# Patient Record
Sex: Male | Born: 1960 | Race: White | Hispanic: No | Marital: Single | Smoking: Former smoker
Health system: Southern US, Community
[De-identification: ages and names within clinical notes are randomized; demographics above are authoritative.]

## PROBLEM LIST (undated history)

## (undated) DIAGNOSIS — I10 Essential (primary) hypertension: Secondary | ICD-10-CM

## (undated) DIAGNOSIS — F32A Depression, unspecified: Secondary | ICD-10-CM

## (undated) DIAGNOSIS — H8110 Benign paroxysmal vertigo, unspecified ear: Secondary | ICD-10-CM

## (undated) DIAGNOSIS — K4090 Unilateral inguinal hernia, without obstruction or gangrene, not specified as recurrent: Secondary | ICD-10-CM

## (undated) DIAGNOSIS — F419 Anxiety disorder, unspecified: Secondary | ICD-10-CM

## (undated) DIAGNOSIS — E7800 Pure hypercholesterolemia, unspecified: Secondary | ICD-10-CM

## (undated) DIAGNOSIS — F329 Major depressive disorder, single episode, unspecified: Secondary | ICD-10-CM

## (undated) HISTORY — PX: KNEE ARTHROSCOPY: SHX127

## (undated) HISTORY — PX: KNEE SURGERY: SHX244

## (undated) HISTORY — DX: Unilateral inguinal hernia, without obstruction or gangrene, not specified as recurrent: K40.90

---

## 2013-10-25 ENCOUNTER — Emergency Department (INDEPENDENT_AMBULATORY_CARE_PROVIDER_SITE_OTHER)
Admission: EM | Admit: 2013-10-25 | Discharge: 2013-10-25 | Disposition: A | Discharge status: Home / Self Care | Payer: Self-pay | Source: Home / Self Care | Attending: Family Medicine | Admitting: Family Medicine

## 2013-10-25 ENCOUNTER — Encounter (HOSPITAL_COMMUNITY): Payer: Self-pay | Admitting: Emergency Medicine

## 2013-10-25 DIAGNOSIS — K047 Periapical abscess without sinus: Secondary | ICD-10-CM

## 2013-10-25 MED ORDER — CLINDAMYCIN HCL 300 MG PO CAPS: 300.0000 mg | ORAL_CAPSULE | Freq: Four times a day (QID) | ORAL | Status: DC

## 2013-10-25 MED ORDER — HYDROCODONE-ACETAMINOPHEN 5-325 MG PO TABS: 1.0000 | ORAL_TABLET | Freq: Four times a day (QID) | ORAL | Status: DC | PRN

## 2013-10-25 NOTE — ED Notes (Signed)
Patient denies pain and is resting comfortably.  

## 2013-10-25 NOTE — ED Notes (Signed)
C/o trouble w tooth since sometime in December

## 2013-10-25 NOTE — ED Notes (Signed)
Discussed [place] clinic in [place] 3[place]6 & 3/7  , and [facility]; discussed medication compliance and precautions

## 2013-10-25 NOTE — Progress Notes (Signed)
   CARE MANAGEMENT ED NOTE 10/25/2013  Patient:  Louis Brown,Louis Brown   Account Number:  [id number]  Date Initiated:  10/25/2013  Documentation initiated by:  Radford PaxFERRERO,Damali Broadfoot  Subjective/Objective Assessment:   [facility].     Subjective/Objective Assessment Detail:     Action/Plan:   Action/Plan Detail:   Anticipated DC Date:  10/25/2013     Status Recommendation to Physician:   Result of Recommendation:    Other ED Services  Consult Working Plan    DC Planning Services  CM consult  MATCH Program  Medication Assistance    Choice offered to / List presented to:            Status of service:  Completed, signed off  ED Comments:   ED Comments Detail:  [facility] for medication [facility]. [facility].  As per EDSW, patient without pcp or insurance and has been working with patient to find pcp.  Information for [facility] and the orange card has been provided to patient as per EDSW. MATCH program initiated.  [facility] [address] at 2017pm at phone [phone], with confirmation of receipt at 2018pm.  [facility] at phone number [phone].  [facility].  Patient was agreeable to three dollar copay.  EDCM advised patient that [facility] would not be able to assist him with medications until this time next year and that he had seven days to fill his prescription otherwise the program will void.  Patient very thankful for services.  No futher EDCM needs at this time.

## 2013-10-25 NOTE — Discharge Instructions (Signed)
°  Dental Abscess °A dental abscess is a collection of infected fluid (pus) from a bacterial infection in the inner part of the tooth (pulp). It usually occurs at the end of the tooth's root.  °CAUSES  °· Severe tooth decay. °· Trauma to the tooth that allows bacteria to enter into the pulp, such as a broken or chipped tooth. °SYMPTOMS  °· Severe pain in and around the infected tooth. °· Swelling and redness around the abscessed tooth or in the mouth or face. °· Tenderness. °· Pus drainage. °· Bad breath. °· Bitter taste in the mouth. °· Difficulty swallowing. °· Difficulty opening the mouth. °· Nausea. °· Vomiting. °· Chills. °· Swollen neck glands. °DIAGNOSIS  °· A medical and dental history will be taken. °· An examination will be performed by tapping on the abscessed tooth. °· X-rays may be taken of the tooth to identify the abscess. °TREATMENT °The goal of treatment is to eliminate the infection. You may be prescribed antibiotic medicine to stop the infection from spreading. A root canal may be performed to save the tooth. If the tooth cannot be saved, it may be pulled (extracted) and the abscess may be drained.  °HOME CARE INSTRUCTIONS °· Only take over-the-counter or prescription medicines for pain, fever, or discomfort as directed by your caregiver. °· Rinse your mouth (gargle) often with salt water (¼ tsp salt in 8 oz [250 ml] of warm water) to relieve pain or swelling. °· Do not drive after taking pain medicine (narcotics). °· Do not apply heat to the outside of your face. °· Return to your dentist for further treatment as directed. °SEEK MEDICAL CARE IF: °· Your pain is not helped by medicine. °· Your pain is getting worse instead of better. °SEEK IMMEDIATE MEDICAL CARE IF: °· You have a fever or persistent symptoms for more than 2 3 days. °· You have a fever and your symptoms suddenly get worse. °· You have chills or a very bad headache. °· You have problems breathing or swallowing. °· You have trouble  opening your mouth. °· You have swelling in the neck or around the eye. °Document Released: 08/17/2005 Document Revised: 05/11/2012 Document Reviewed: 11/25/2010 °ExitCare® Patient Information ©2014 [employer]. ° ° °

## 2013-10-25 NOTE — ED Provider Notes (Signed)
CSN: [id number]     Arrival date & time 10/25/13  1453 History   First MD Initiated Contact with Patient 10/25/13 1738     Chief Complaint  Patient presents with  . Dental Problem     (Consider location/radiation/quality/duration/timing/severity/associated sxs/prior Treatment) HPI Comments: Patient reports he develop a loose filling in left lower molar in Dec. 2015. Reports that beginning 10/20/2013 he developed increasing pain and facial swelling. Denies fever. Last dental visit about 6 years ago.   The history is provided by the patient.    History reviewed. No pertinent past medical history. Past Surgical History  Procedure Laterality Date  . Knee surgery     History reviewed. No pertinent family history. History  Substance Use Topics  . Smoking status: Never Smoker   . Smokeless tobacco: Not on file  . Alcohol Use: Yes    Review of Systems  All other systems reviewed and are negative.      Allergies  Codeine  Home Medications   Current Outpatient Rx  Name  Route  Sig  Dispense  Refill  . clindamycin (CLEOCIN) 300 MG capsule   Oral   Take 1 capsule (300 mg total) by mouth 4 (four) times daily. X 7 days   28 capsule   0   . HYDROcodone-acetaminophen (NORCO/VICODIN) 5-325 MG per tablet   Oral   Take 1-2 tablets by mouth every 6 (six) hours as needed for moderate pain or severe pain.   15 tablet   0    There were no vitals taken for this visit. Physical Exam  Nursing note reviewed. Constitutional: He is oriented to person, place, and time. He appears well-developed and well-nourished. No distress.  HENT:  Head: Normocephalic and atraumatic.    Right Ear: External ear normal.  Left Ear: External ear normal.  Nose: Nose normal.  Mouth/Throat: Uvula is midline, oropharynx is clear and moist and mucous membranes are normal. Dental caries present.    Eyes: Conjunctivae are normal.  Neck: Normal range of motion. Neck supple.  Cardiovascular: Normal  rate.   Pulmonary/Chest: Effort normal.  Musculoskeletal: Normal range of motion.  Lymphadenopathy:    He has no cervical adenopathy.  Neurological: He is alert and oriented to person, place, and time.  Skin: Skin is warm and dry.  Psychiatric: He has a normal mood and affect. His behavior is normal.    ED Course  Procedures (including critical care time) Labs Review Labs Reviewed - No data to display Imaging Review No results found.    MDM   Final diagnoses:  Dental abscess  Patient with likely dental/periapical abscess. Provided patient information [place] [facility] [place] [facility]. Will treat with oral clindamycin and pain medication and advise close dental follow up. BP recorded on intake paperwork as 165/109. Patient advised to have BP re-checked in 7-10 days.   Jess BartersJennifer Lee [place], [place] 10/25/13 [phone]

## 2013-10-26 NOTE — Progress Notes (Signed)
10/26/2013 A. Sadia Belfiore RNCM 1522pm [facility] called patient for follow up.  As per patient, he was able to get RX filled for antibiotic without difficulty and is making arrangements for a follow up visit to physician.  Patient thanked [facility] for services.

## 2013-10-27 NOTE — ED Provider Notes (Signed)
Medical screening examination/treatment/procedure(s) were performed by a resident physician or non-physician practitioner and as the supervising physician I was immediately available for consultation/collaboration.  Clementeen GrahamEvan Doran Nestle, MD    Rodolph BongEvan S Lakynn Halvorsen, MD 10/27/13 [phone]

## 2013-11-07 ENCOUNTER — Ambulatory Visit: Payer: Self-pay | Attending: Internal Medicine | Admitting: Internal Medicine

## 2013-11-07 ENCOUNTER — Encounter: Payer: Self-pay | Admitting: Internal Medicine

## 2013-11-07 VITALS — BP 131/87 | HR 74 | Temp 98.5°F | Resp 14 | Ht 72.0 in | Wt 205.8 lb

## 2013-11-07 DIAGNOSIS — R109 Unspecified abdominal pain: Secondary | ICD-10-CM | POA: Insufficient documentation

## 2013-11-07 DIAGNOSIS — K047 Periapical abscess without sinus: Secondary | ICD-10-CM | POA: Insufficient documentation

## 2013-11-07 DIAGNOSIS — K4090 Unilateral inguinal hernia, without obstruction or gangrene, not specified as recurrent: Secondary | ICD-10-CM | POA: Insufficient documentation

## 2013-11-07 DIAGNOSIS — Z0000 Encounter for general adult medical examination without abnormal findings: Secondary | ICD-10-CM

## 2013-11-07 LAB — CBC WITH DIFFERENTIAL/PLATELET
BASOS PCT: 1 % (ref 0–1)
Basophils Absolute: 0.1 10*3/uL (ref 0.0–0.1)
EOS ABS: 0.1 10*3/uL (ref 0.0–0.7)
Eosinophils Relative: 2 % (ref 0–5)
HEMATOCRIT: 44.5 % (ref 39.0–52.0)
Hemoglobin: 15.7 g/dL (ref 13.0–17.0)
Lymphocytes Relative: 28 % (ref 12–46)
Lymphs Abs: 1.6 10*3/uL (ref 0.7–4.0)
MCH: 31.2 pg (ref 26.0–34.0)
MCHC: 35.3 g/dL (ref 30.0–36.0)
MCV: 88.3 fL (ref 78.0–100.0)
MONO ABS: 0.6 10*3/uL (ref 0.1–1.0)
MONOS PCT: 11 % (ref 3–12)
NEUTROS PCT: 58 % (ref 43–77)
Neutro Abs: 3.2 10*3/uL (ref 1.7–7.7)
Platelets: 319 10*3/uL (ref 150–400)
RBC: 5.04 MIL/uL (ref 4.22–5.81)
RDW: 13 % (ref 11.5–15.5)
WBC: 5.6 10*3/uL (ref 4.0–10.5)

## 2013-11-07 LAB — COMPLETE METABOLIC PANEL WITH GFR
ALK PHOS: 60 U/L (ref 39–117)
ALT: 22 U/L (ref 0–53)
AST: 20 U/L (ref 0–37)
Albumin: 4.4 g/dL (ref 3.5–5.2)
BILIRUBIN TOTAL: 0.6 mg/dL (ref 0.2–1.2)
BUN: 17 mg/dL (ref 6–23)
CO2: 28 mEq/L (ref 19–32)
Calcium: 9.7 mg/dL (ref 8.4–10.5)
Chloride: 103 mEq/L (ref 96–112)
Creat: 1.26 mg/dL (ref 0.50–1.35)
GFR, Est African American: 76 mL/min
GFR, Est Non African American: 66 mL/min
GLUCOSE: 96 mg/dL (ref 70–99)
Potassium: 4.4 mEq/L (ref 3.5–5.3)
Sodium: 140 mEq/L (ref 135–145)
Total Protein: 7 g/dL (ref 6.0–8.3)

## 2013-11-07 LAB — LIPID PANEL
Cholesterol: 207 mg/dL — ABNORMAL HIGH (ref 0–200)
HDL: 44 mg/dL (ref >39)
LDL Cholesterol: 109 mg/dL — ABNORMAL HIGH (ref 0–99)
Total CHOL/HDL Ratio: 4.7 Ratio
Triglycerides: 269 mg/dL — ABNORMAL HIGH (ref <150)
VLDL: 54 mg/dL — ABNORMAL HIGH (ref 0–40)

## 2013-11-07 LAB — TSH: TSH: 1.603 u[IU]/mL (ref 0.350–4.500)

## 2013-11-07 MED ORDER — AMOXICILLIN-POT CLAVULANATE 875-125 MG PO TABS: 1.0000 | ORAL_TABLET | Freq: Two times a day (BID) | ORAL | Status: DC

## 2013-11-07 NOTE — Progress Notes (Signed)
Patient ID: Louis Brown, male   DOB: 08-28-1961, 53 y.o.   MRN: [id number]   CC:  HPI: 53 year old male here to establish care. The patient believes an organic farming and eats very healthy. The patient is here today with a dental abscess, recently seen in urgent care and treated with clindamycin. The patient has completed his antibiotic course. He is also complaining of left inguinal pain and has a left inguinal hernia on exam. He has no other specific complaints. He tells me that he had a colonoscopy in 2008 had 5 polyps removed.   social history nonsmoker nonalcoholic  Family history positive for diabetes and hypertension in the mother and sister Father died of glomerulonephritis   Allergies  Allergen Reactions  . Codeine    History reviewed. No pertinent past medical history. No current outpatient prescriptions on file prior to visit.   No current facility-administered medications on file prior to visit.   Family History  Problem Relation Age of Onset  . Kidney disease Father    History   Social History  . Marital Status: Single    Spouse Name: N/A    Number of Children: N/A  . Years of Education: N/A   Occupational History  . Not on file.   Social History Main Topics  . Smoking status: Never Smoker   . Smokeless tobacco: Not on file  . Alcohol Use: Yes  . Drug Use: Not on file  . Sexual Activity: Not on file   Other Topics Concern  . Not on file   Social History Narrative  . No narrative on file    Review of Systems  Constitutional: Negative for fever, chills, diaphoresis, activity change, appetite change and fatigue.  HENT: Negative for ear pain, nosebleeds, congestion, facial swelling, rhinorrhea, neck pain, neck stiffness and ear discharge.   Eyes: Negative for pain, discharge, redness, itching and visual disturbance.  Respiratory: Negative for cough, choking, chest tightness, shortness of breath, wheezing and stridor.   Cardiovascular: Negative for  chest pain, palpitations and leg swelling.  Gastrointestinal: Negative for abdominal distention.  Genitourinary: Negative for dysuria, urgency, frequency, hematuria, flank pain, decreased urine volume, difficulty urinating and dyspareunia.  Musculoskeletal: Negative for back pain, joint swelling, arthralgias and gait problem.  Neurological: Negative for dizziness, tremors, seizures, syncope, facial asymmetry, speech difficulty, weakness, light-headedness, numbness and headaches.  Hematological: Negative for adenopathy. Does not bruise/bleed easily.  Psychiatric/Behavioral: Negative for hallucinations, behavioral problems, confusion, dysphoric mood, decreased concentration and agitation.    Objective:   Filed Vitals:   11/07/13 0958  BP: 131/87  Pulse: 74  Temp: 98.5 F (36.9 C)  Resp: 14    Physical Exam  Constitutional: Appears well-developed and well-nourished. No distress.  HENT: Normocephalic. External right and left ear normal. Oropharynx is clear and moist.  Eyes: Conjunctivae and EOM are normal. PERRLA, no scleral icterus.  Neck: Normal ROM. Neck supple. No JVD. No tracheal deviation. No thyromegaly.  CVS: RRR, S1/S2 +, no murmurs, no gallops, no carotid bruit.  Pulmonary: Effort and breath sounds normal, no stridor, rhonchi, wheezes, rales.  Abdominal: Soft. BS +,  no distension, tenderness, rebound or guarding.  Musculoskeletal: Normal range of motion. No edema and no tenderness.  Lymphadenopathy: No lymphadenopathy noted, cervical, inguinal. Neuro: Alert. Normal reflexes, muscle tone coordination. No cranial nerve deficit. Skin: Skin is warm and dry. No rash noted. Not diaphoretic. No erythema. No pallor.  Psychiatric: Normal mood and affect. Behavior, judgment, thought content normal.   No results found for  this basename: WBC, HGB, HCT, MCV, PLT   No results found for this basename: CREATININE, BUN, NA, K, CL, CO2    No results found for this basename: HGBA1C    Lipid Panel  No results found for this basename: chol, trig, hdl, cholhdl, vldl, ldlcalc       Assessment and plan:   There are no active problems to display for this patient.  Left inguinal hernia Gen. surgery referral for surgery  Dental abscess Provided a prescription for Augmentin The dentist referral  Establish care Repeat colonoscopy, based on his history the patient needs a colonoscopy again. He had 5 polyps removed in 2008 Refusing vaccination   Follow up in 6-8 months      The patient was given clear instructions to go to ER or return to medical center if symptoms don't improve, worsen or new problems develop. The patient verbalized understanding. The patient was told to call to get any lab results if not heard anything in the next week.

## 2013-11-07 NOTE — Progress Notes (Signed)
Patient is here to establish care. Patient had an abscess tooth and fillings have cracked. Also complains of a hernia x3 years. Pain scale of 2 today; pain occurs with movement. No OTC medications.

## 2013-11-08 ENCOUNTER — Telehealth: Payer: Self-pay | Admitting: Emergency Medicine

## 2013-11-08 LAB — VITAMIN D 25 HYDROXY (VIT D DEFICIENCY, FRACTURES): Vit D, 25-Hydroxy: 20 ng/mL — ABNORMAL LOW (ref 30–89)

## 2013-11-08 MED ORDER — ERGOCALCIFEROL 1.25 MG (50000 UT) PO CAPS: 50000.0000 [IU] | ORAL_CAPSULE | ORAL | Status: DC

## 2013-11-08 NOTE — Addendum Note (Signed)
Addended by: Susie CassetteABROL MD, Germain OsgoodNAYANA on: 11/08/2013 09:44 AM   Modules accepted: Orders

## 2013-11-08 NOTE — Telephone Encounter (Signed)
Message copied by Darlis LoanSMITH, JILL D on Wed Nov 08, 2013  2:46 PM ------      Message from: Susie CassetteABROL MD, Germain OsgoodNAYANA      Created: Wed Nov 08, 2013  9:45 AM       Please notify patient of the patient's triglycerides are mildly elevated at 269. He is encouraged low-fat diet and moderate exercise and we will repeat cholesterol panel in 2 months.      Vitamin D level is low. Called in a prescription for vitamin D to her preferred pharmacy ------

## 2013-11-10 NOTE — Telephone Encounter (Signed)
Message copied by Darlis LoanSMITH, JILL D on Fri Nov 10, 2013  1:02 PM ------      Message from: Susie CassetteABROL MD, Germain OsgoodNAYANA      Created: Wed Nov 08, 2013  9:45 AM       Please notify patient of the patient's triglycerides are mildly elevated at 269. He is encouraged low-fat diet and moderate exercise and we will repeat cholesterol panel in 2 months.      Vitamin D level is low. Called in a prescription for vitamin D to her preferred pharmacy ------

## 2013-11-10 NOTE — Telephone Encounter (Signed)
Pt given lab  Results with instructions Verbalized understanding

## 2013-11-21 ENCOUNTER — Ambulatory Visit: Payer: Self-pay | Attending: Internal Medicine

## 2013-11-28 ENCOUNTER — Encounter (HOSPITAL_COMMUNITY): Payer: Self-pay | Admitting: Emergency Medicine

## 2013-11-28 ENCOUNTER — Emergency Department (HOSPITAL_COMMUNITY)
Admission: EM | Admit: 2013-11-28 | Discharge: 2013-11-28 | Disposition: A | Discharge status: Home / Self Care | Payer: Self-pay | Attending: Emergency Medicine | Admitting: Emergency Medicine

## 2013-11-28 DIAGNOSIS — M549 Dorsalgia, unspecified: Secondary | ICD-10-CM

## 2013-11-28 DIAGNOSIS — K4090 Unilateral inguinal hernia, without obstruction or gangrene, not specified as recurrent: Secondary | ICD-10-CM | POA: Insufficient documentation

## 2013-11-28 LAB — URINALYSIS, ROUTINE W REFLEX MICROSCOPIC
Bilirubin Urine: NEGATIVE
Glucose, UA: NEGATIVE mg/dL
Hgb urine dipstick: NEGATIVE
KETONES UR: 15 mg/dL — AB
LEUKOCYTES UA: NEGATIVE
Nitrite: NEGATIVE
PROTEIN: NEGATIVE mg/dL
Specific Gravity, Urine: 1.025 (ref 1.005–1.030)
Urobilinogen, UA: 0.2 mg/dL (ref 0.0–1.0)
pH: 5.5 (ref 5.0–8.0)

## 2013-11-28 MED ORDER — OXYCODONE-ACETAMINOPHEN 5-325 MG PO TABS: 2.0000 | ORAL_TABLET | Freq: Four times a day (QID) | ORAL | Status: DC | PRN

## 2013-11-28 MED ORDER — KETOROLAC TROMETHAMINE 60 MG/2ML IM SOLN
60.0000 mg | Freq: Once | INTRAMUSCULAR | Status: DC
  Filled 2013-11-28: qty 2

## 2013-11-28 MED ORDER — ONDANSETRON 4 MG PO TBDP
8.0000 mg | ORAL_TABLET | Freq: Once | ORAL | Status: AC
  Administered 2013-11-28: 8 mg via ORAL
  Filled 2013-11-28: qty 2

## 2013-11-28 MED ORDER — DIAZEPAM 5 MG PO TABS: 5.0000 mg | ORAL_TABLET | Freq: Two times a day (BID) | ORAL | Status: DC

## 2013-11-28 MED ORDER — OXYCODONE-ACETAMINOPHEN 5-325 MG PO TABS
2.0000 | ORAL_TABLET | Freq: Once | ORAL | Status: AC
  Administered 2013-11-28: 2 via ORAL
  Filled 2013-11-28: qty 2

## 2013-11-28 NOTE — Discharge Instructions (Signed)
Back Pain, Adult Low back pain is very common. About 1 in 5 people have back pain.The cause of low back pain is rarely dangerous. The pain often gets better over time.About half of people with a sudden onset of back pain feel better in just 2 weeks. About 8 in 10 people feel better by 6 weeks.  CAUSES Some common causes of back pain include:  Strain of the muscles or ligaments supporting the spine.  Wear and tear (degeneration) of the spinal discs.  Arthritis.  Direct injury to the back. DIAGNOSIS Most of the time, the direct cause of low back pain is not known.However, back pain can be treated effectively even when the exact cause of the pain is unknown.Answering your caregiver's questions about your overall health and symptoms is one of the most accurate ways to make sure the cause of your pain is not dangerous. If your caregiver needs more information, he or she may order lab work or imaging tests (X-rays or MRIs).However, even if imaging tests show changes in your back, this usually does not require surgery. HOME CARE INSTRUCTIONS For many people, back pain returns.Since low back pain is rarely dangerous, it is often a condition that people can learn to [facility] their own.   Remain active. It is stressful on the back to sit or stand in one place. Do not sit, drive, or stand in one place for more than 30 minutes at a time. Take short walks on level surfaces as soon as pain allows.Try to increase the length of time you walk each day.  Do not stay in bed.Resting more than 1 or 2 days can delay your recovery.  Do not avoid exercise or work.Your body is made to move.It is not dangerous to be active, even though your back may hurt.Your back will likely heal faster if you return to being active before your pain is gone.  Pay attention to your body when you bend and lift. Many people have less discomfortwhen lifting if they bend their knees, keep the load close to their bodies,and  avoid twisting. Often, the most comfortable positions are those that put less stress on your recovering back.  Find a comfortable position to sleep. Use a firm mattress and lie on your side with your knees slightly bent. If you lie on your back, put a pillow under your knees.  Only take over-the-counter or prescription medicines as directed by your caregiver. Over-the-counter medicines to reduce pain and inflammation are often the most helpful.Your caregiver may prescribe muscle relaxant drugs.These medicines help dull your pain so you can more quickly return to your normal activities and healthy exercise.  Put ice on the injured area.  Put ice in a plastic bag.  Place a towel between your skin and the bag.  Leave the ice on for 15-20 minutes, 03-04 times a day for the first 2 to 3 days. After that, ice and heat may be alternated to reduce pain and spasms.  Ask your caregiver about trying back exercises and gentle massage. This may be of some benefit.  Avoid feeling anxious or stressed.Stress increases muscle tension and can worsen back pain.It is important to recognize when you are anxious or stressed and learn ways to manage it.Exercise is a great option. SEEK MEDICAL CARE IF:  You have pain that is not relieved with rest or medicine.  You have pain that does not improve in 1 week.  You have new symptoms.  You are generally not feeling well. SEEK  IMMEDIATE MEDICAL CARE IF:   You have pain that radiates from your back into your legs.  You develop new bowel or bladder control problems.  You have unusual weakness or numbness in your arms or legs.  You develop nausea or vomiting.  You develop abdominal pain.  You feel faint. Document Released: 08/17/2005 Document Revised: 02/16/2012 Document Reviewed: 01/05/2011 [facility] Patient Information 2014 [place], [place].  Inguinal Hernia, Adult Muscles help keep everything in the body in its proper place. But if a weak spot in  the muscles develops, something can poke through. That is called a hernia. When this happens in the lower part of the belly (abdomen), it is called an inguinal hernia. (It takes its name from a part of the body in this region called the inguinal canal.) A weak spot in the wall of muscles lets some fat or part of the small intestine bulge through. An inguinal hernia can develop at any age. Men get them more often than women. CAUSES  In adults, an inguinal hernia develops over time.  It can be triggered by:  Suddenly straining the muscles of the lower abdomen.  Lifting heavy objects.  Straining to have a bowel movement. Difficult bowel movements (constipation) can lead to this.  Constant coughing. This may be caused by smoking or lung disease.  Being overweight.  Being pregnant.  Working at a job that requires long periods of standing or heavy lifting.  Having had an inguinal hernia before. One type can be an emergency situation. It is called a strangulated inguinal hernia. It develops if part of the small intestine slips through the weak spot and cannot get back into the abdomen. The blood supply can be cut off. If that happens, part of the intestine may die. This situation requires emergency surgery. SYMPTOMS  Often, a small inguinal hernia has no symptoms. It is found when a healthcare provider does a physical exam. Larger hernias usually have symptoms.   In adults, symptoms may include:  A lump in the groin. This is easier to see when the person is standing. It might disappear when lying down.  In men, a lump in the scrotum.  Pain or burning in the groin. This occurs especially when lifting, straining or coughing.  A dull ache or feeling of pressure in the groin.  Signs of a strangulated hernia can include:  A bulge in the groin that becomes very painful and tender to the touch.  A bulge that turns red or purple.  Fever, nausea and vomiting.  Inability to have a bowel  movement or to pass gas. DIAGNOSIS  To decide if you have an inguinal hernia, a healthcare provider will probably do a physical examination.  This will include asking questions about any symptoms you have noticed.  The healthcare provider might feel the groin area and ask you to cough. If an inguinal hernia is felt, the healthcare provider may try to slide it back into the abdomen.  Usually no other tests are needed. TREATMENT  Treatments can vary. The size of the hernia makes a difference. Options include:  Watchful waiting. This is often suggested if the hernia is small and you have had no symptoms.  No medical procedure will be done unless symptoms develop.  You will need to watch closely for symptoms. If any occur, contact your healthcare provider right away.  Surgery. This is used if the hernia is larger or you have symptoms.  Open surgery. This is usually an outpatient procedure (you  will not stay overnight in a hospital). An cut (incision) is made through the skin in the groin. The hernia is put back inside the abdomen. The weak area in the muscles is then repaired by herniorrhaphy or hernioplasty. Herniorrhaphy: in this type of surgery, the weak muscles are sewn back together. Hernioplasty: a patch or mesh is used to close the weak area in the abdominal wall.  Laparoscopy. In this procedure, a surgeon makes small incisions. A thin tube with a tiny video camera (called a laparoscope) is put into the abdomen. The surgeon repairs the hernia with mesh by looking with the video camera and using two long instruments. HOME CARE INSTRUCTIONS   After surgery to repair an inguinal hernia:  You will need to take pain medicine prescribed by your healthcare provider. Follow all directions carefully.  You will need to take care of the wound from the incision.  Your activity will be restricted for awhile. This will probably include no heavy lifting for several weeks. You also should not do  anything too active for a few weeks. When you can return to work will depend on the type of job that you have.  During "watchful waiting" periods, you should:  Maintain a healthy weight.  Eat a diet high in fiber (fruits, vegetables and whole grains).  Drink plenty of fluids to avoid constipation. This means drinking enough water and other liquids to keep your urine clear or pale yellow.  Do not lift heavy objects.  Do not stand for long periods of time.  Quit smoking. This should keep you from developing a frequent cough. SEEK MEDICAL CARE IF:   A bulge develops in your groin area.  You feel pain, a burning sensation or pressure in the groin. This might be worse if you are lifting or straining.  You develop a fever of more than 100.5 F (38.1 C). SEEK IMMEDIATE MEDICAL CARE IF:   Pain in the groin increases suddenly.  A bulge in the groin gets bigger suddenly and does not go down.  For men, there is sudden pain in the scrotum. Or, the size of the scrotum increases.  A bulge in the groin area becomes red or purple and is painful to touch.  You have nausea or vomiting that does not go away.  You feel your heart beating much faster than normal.  You cannot have a bowel movement or pass gas.  You develop a fever of more than 102.0 F (38.9 C). Document Released: 01/03/2009 Document Revised: 11/09/2011 Document Reviewed: 01/03/2009 [facility] Patient Information 2014 [place], [place].

## 2013-11-28 NOTE — ED Notes (Signed)
Pt in from home with c/o right flank pain since Sunday, pt states pain gets worse with bending and movement, states he has had to use crutches to ambulate bc pain is severe. States he has hx of same pain but never was seen for it, states some "cloudy" urine since Sunday as well. Pt also states hx of inguinal hernia on left side, denies any n/v/d. Pt is alert and oriented, nad noted.

## 2013-11-28 NOTE — ED Notes (Signed)
Called into patient's room, pt c/o itchiness on right arm and under his nose. Pt reports he is not having difficulty breathing, throat is not itchy/swollen, tongue is not swollen. Pt calm and otherwise comfortable. Burna MortimerWanda, Case Manager RN at bedside. Primary RN Duwayne Heckanielle made aware.

## 2013-11-28 NOTE — ED Provider Notes (Signed)
CSN: [id number]     Arrival date & time 11/28/13  1059 History   First MD Initiated Contact with Patient 11/28/13 1107     Chief Complaint  Patient presents with  . Flank Pain     (Consider location/radiation/quality/duration/timing/severity/associated sxs/prior Treatment) HPI Comments: Patient is a 53 year old otherwise healthy male who presents today with 3 days of gradually worsening right-sided back pain. He reports that he pain is sharp and burning. It is worse with movement and walking. The pain was so severe that he was unable to ambulate on Sunday. He had no injury. He had finished mowing his yard when the pain began. The pain radiates into the back of his right leg. Additionally he has a hernia. The hernia is reducible. [facility] due to lack of insurance. He has cloudy urine in the morning. This has been going on for the past month. He denies bowel or bladder incontinence, history of cancer, drug abuse.  The history is provided by the patient. No language interpreter was used.    No past medical history on file. Past Surgical History  Procedure Laterality Date  . Knee surgery    . Knee arthroscopy     Family History  Problem Relation Age of Onset  . Kidney disease Father    History  Substance Use Topics  . Smoking status: Never Smoker   . Smokeless tobacco: Not on file  . Alcohol Use: Yes    Review of Systems  Constitutional: Negative for fever and chills.  Respiratory: Negative for shortness of breath.   Cardiovascular: Negative for chest pain.  Gastrointestinal: Negative for nausea, vomiting and abdominal pain.  Genitourinary: Negative for dysuria, frequency and difficulty urinating.       Cloudy am urine  Musculoskeletal: Positive for arthralgias, back pain, gait problem and myalgias.  All other systems reviewed and are negative.      Allergies  Codeine  Home Medications   Current Outpatient Rx  Name   Route  Sig  Dispense  Refill  . ergocalciferol (VITAMIN D2) 50000 UNITS capsule   Oral   Take 50,000 Units by mouth once a week. On tuesdays         . amoxicillin-clavulanate (AUGMENTIN) 875-125 MG per tablet   Oral   Take 1 tablet by mouth 2 (two) times daily.   20 tablet   0    BP 145/86  Pulse 90  Temp(Src) 98.2 F (36.8 C) (Oral)  Resp 12  SpO2 98% Physical Exam  Nursing note and vitals reviewed. Constitutional: He is oriented to person, place, and time. He appears well-developed and well-nourished. No distress.  HENT:  Head: Normocephalic and atraumatic.  Right Ear: External ear normal.  Left Ear: External ear normal.  Nose: Nose normal.  Eyes: Conjunctivae are normal.  Neck: Normal range of motion. No tracheal deviation present.  Cardiovascular: Normal rate, regular rhythm, normal heart sounds, intact distal pulses and normal pulses.   Pulses:      Radial pulses are 2+ on the right side, and 2+ on the left side.       Dorsalis pedis pulses are 2+ on the right side, and 2+ on the left side.       Posterior tibial pulses are 2+ on the right side, and 2+ on the left side.  Pulmonary/Chest: Effort normal and breath sounds normal. No stridor.  Abdominal: Soft. He exhibits no distension. There is no tenderness.  A hernia is present. Hernia confirmed positive in the left inguinal area.  Genitourinary:  Reducible hernia on left.   Musculoskeletal: Normal range of motion.       Back:  SLR positive on right  Neurological: He is alert and oriented to person, place, and time.  Skin: Skin is warm and dry. He is not diaphoretic.  Psychiatric: He has a normal mood and affect. His behavior is normal.    ED Course  Procedures (including critical care time) Labs Review Labs Reviewed  URINALYSIS, ROUTINE W REFLEX MICROSCOPIC - Abnormal; Notable for the following:    Color, Urine AMBER (*)    Ketones, ur 15 (*)    All other components within normal limits  URINE CULTURE    Imaging Review No results found.   EKG Interpretation None      MDM   Final diagnoses:  Back pain  Inguinal hernia   Patient with back pain.  No neurological deficits and normal neuro exam.  Patient can walk but states is painful.  No loss of bowel or bladder control.  No concern for cauda equina.  No fever, night sweats, weight loss, h/o cancer, IVDU.  RICE protocol and pain medicine indicated and discussed with patient. Patient also with reducible left inguinal hernia. Discussed reasons to return to ED immediately. Gave referral to [facility].      Mora Bellman, PA-C 11/28/13 [phone]

## 2013-11-28 NOTE — ED Notes (Signed)
Pt provided urinal for urine sample.

## 2013-11-29 LAB — URINE CULTURE
Colony Count: NO GROWTH
Culture: NO GROWTH

## 2013-12-01 NOTE — ED Provider Notes (Signed)
Medical screening examination/treatment/procedure(s) were performed by non-physician practitioner and as supervising physician I was immediately available for consultation/collaboration.   Toy BakerAnthony T Claira Jeter, MD 12/01/13 [phone]

## 2013-12-11 ENCOUNTER — Encounter: Payer: Self-pay | Admitting: Internal Medicine

## 2014-07-10 ENCOUNTER — Ambulatory Visit: Payer: Self-pay | Admitting: Internal Medicine

## 2015-03-20 ENCOUNTER — Ambulatory Visit: Payer: Self-pay | Attending: Family Medicine | Admitting: Family Medicine

## 2015-03-20 ENCOUNTER — Encounter: Payer: Self-pay | Admitting: Family Medicine

## 2015-03-20 VITALS — BP 121/84 | HR 73 | Temp 98.6°F | Ht 72.0 in | Wt 215.0 lb

## 2015-03-20 DIAGNOSIS — L02212 Cutaneous abscess of back [any part, except buttock]: Secondary | ICD-10-CM

## 2015-03-20 DIAGNOSIS — L723 Sebaceous cyst: Secondary | ICD-10-CM | POA: Insufficient documentation

## 2015-03-20 MED ORDER — LIDOCAINE HCL (PF) 2 % IJ SOLN: 2.0000 mL | Freq: Once | INTRAMUSCULAR | Status: DC

## 2015-03-20 MED ORDER — CEPHALEXIN 500 MG PO CAPS: 500.0000 mg | ORAL_CAPSULE | Freq: Four times a day (QID) | ORAL | Status: DC

## 2015-03-20 NOTE — Progress Notes (Signed)
   Subjective:    Patient ID: Louis Brown, male    DOB: [birth year], 54 y.o.   MRN: [id number]  HPI Complains of irritation of a sebaceous cyst which he has had for 10 years. The cyst has gradually increased in size and has become reddish and tender. He has not applied any OTC medication but had been using a hot compress on it. Denies fever.  Past Medical History  Diagnosis Date  . Inguinal hernia     Past Surgical History  Procedure Laterality Date  . Knee surgery    . Knee arthroscopy      History   Social History  . Marital Status: Single    Spouse Name: N/A  . Number of Children: N/A  . Years of Education: N/A   Occupational History  . Not on file.   Social History Main Topics  . Smoking status: Never Smoker   . Smokeless tobacco: Not on file  . Alcohol Use: Yes     Comment: social  . Drug Use: Not on file  . Sexual Activity: Not on file   Other Topics Concern  . Not on file   Social History Narrative    Allergies  Allergen Reactions  . Codeine Itching     Review of Systems  General: negative for fever, weight loss, appetite change Eyes: no visual symptoms. ENT: no ear symptoms, no sinus tenderness, no nasal congestion or sore throat. Neck: no pain  Respiratory: no wheezing, shortness of breath, cough Cardiovascular: no chest pain, no dyspnea on exertion, no pedal edema, no orthopnea. Gastrointestinal: no abdominal pain, no diarrhea, no constipation Genito-Urinary: no urinary frequency, no dysuria, no polyuria. Hematologic: no bruising Endocrine: no cold or heat intolerance Neurological: no headaches, no seizures, no tremors Musculoskeletal: no joint pains, no joint swelling Skin: see hpi Psychological: no depression, no anxiety,       Objective: Filed Vitals:   03/20/15 1203  BP: 121/84  Pulse: 73  Temp: 98.6 F (37 C)  Height: 6' (1.829 m)  Weight: 215 lb (97.523 kg)  SpO2: 98%      Physical Exam Constitutional: normal appearing,    Neck: normal range of motion, no thyromegaly, no JVD Cardiovascular: normal rate and rhythm, normal heart sounds, no murmurs, rub or gallop, no pedal edema Respiratory: clear to auscultation bilaterally, no wheezes, no rales, no rhonchi Abdomen: soft, not tender to palpation, normal bowel sounds, no enlarged organs Extremities: Full ROM, no tenderness in joints Skin: 3 x 4 cm sebaceous cyst in the mid back which is infected evidenced by erythema and tenderness along with central punctum.         Assessment & Plan:  54 year old male with an infectious sebaceous cyst. Informed consent obtained,timeout observed. Area skin prepped with Betadine, local anesthesia performed with 2 cc 2% lidocaine with epinephrine. Cyst punctured with scalpel and purulent discharge drained. Dressing applied; patient tolerated procedure well. Placed on Keflex by mouth.  Will see back in 1 week.

## 2015-03-20 NOTE — Progress Notes (Signed)
Has had cyst on his back for 10 years or so.  Several days ago it became enlarged and very painful He is taking no medications

## 2015-03-20 NOTE — Patient Instructions (Addendum)
Epidermal Cyst An epidermal cyst is usually a small, painless lump under the skin. Cysts often occur on the face, neck, stomach, chest, or genitals. The cyst may be filled with a bad smelling paste. Do not pop your cyst. Popping the cyst can cause pain and puffiness (swelling). HOME CARE   Only take medicines as told by your doctor.  Take your medicine (antibiotics) as told. Finish it even if you start to feel better. GET HELP RIGHT AWAY IF:  Your cyst is tender, red, or puffy.  You are not getting better, or you are getting worse.  You have any questions or concerns. MAKE SURE YOU:  Understand these instructions.  Will watch your condition.  Will get help right away if you are not doing well or get worse. Document Released: 09/24/2004 Document Revised: 02/16/2012 Document Reviewed: 02/23/2011 Exit[employer] Patient Information 2015 [employer]. This information is not intended to replace advice given to you by your health care provider. Make sure you discuss any questions you have with your health care provider.  

## 2015-03-27 ENCOUNTER — Ambulatory Visit: Payer: Self-pay | Attending: Family Medicine | Admitting: Family Medicine

## 2015-03-27 ENCOUNTER — Encounter: Payer: Self-pay | Admitting: Family Medicine

## 2015-03-27 VITALS — BP 126/83 | HR 75 | Temp 98.0°F | Resp 18 | Ht 72.0 in | Wt 213.6 lb

## 2015-03-27 DIAGNOSIS — Z79899 Other long term (current) drug therapy: Secondary | ICD-10-CM | POA: Insufficient documentation

## 2015-03-27 DIAGNOSIS — L723 Sebaceous cyst: Secondary | ICD-10-CM | POA: Insufficient documentation

## 2015-03-27 DIAGNOSIS — K4090 Unilateral inguinal hernia, without obstruction or gangrene, not specified as recurrent: Secondary | ICD-10-CM | POA: Insufficient documentation

## 2015-03-27 DIAGNOSIS — K4091 Unilateral inguinal hernia, without obstruction or gangrene, recurrent: Secondary | ICD-10-CM

## 2015-03-27 NOTE — Patient Instructions (Signed)

## 2015-03-27 NOTE — Progress Notes (Signed)
Patient here for follow up for cyst located in the middle of his back. Patient denies any pain in location. Patient has taken Keflex today.

## 2015-03-27 NOTE — Progress Notes (Signed)
Subjective:    Patient ID: Louis Brown, male    DOB: [birth year], 54 y.o.   MRN: [id number]  HPI Louis Brown had an incision and drainage of an infected sebaceous cyst which she has had for 10 years in the middle of his back last week and was subsequently placed on Keflex which he is still taking. Cyst is no longer tender to palpation but is still present and he would like it excised.  He also has a left inguinal hernia which has been present for 3 years and is reducible; it hurts more when he stands but he denies any nausea or vomiting or abdominal pain. He has been unable to have it fixed due to lack of insurance.  Past Medical History  Diagnosis Date  . Inguinal hernia     Past Surgical History  Procedure Laterality Date  . Knee surgery    . Knee arthroscopy      History   Social History  . Marital Status: Single    Spouse Name: N/A  . Number of Children: N/A  . Years of Education: N/A   Occupational History  . Not on file.   Social History Main Topics  . Smoking status: Never Smoker   . Smokeless tobacco: Not on file  . Alcohol Use: Yes     Comment: social  . Drug Use: No  . Sexual Activity: Not on file   Other Topics Concern  . Not on file   Social History Narrative    Allergies  Allergen Reactions  . Codeine Itching    Current Outpatient Prescriptions on File Prior to Visit  Medication Sig Dispense Refill  . cephALEXin (KEFLEX) 500 MG capsule Take 1 capsule (500 mg total) by mouth 4 (four) times daily. 40 capsule 0  . amoxicillin-clavulanate (AUGMENTIN) 875-125 MG per tablet Take 1 tablet by mouth 2 (two) times daily. (Patient not taking: Reported on 03/20/2015) 20 tablet 0  . diazepam (VALIUM) 5 MG tablet Take 1 tablet (5 mg total) by mouth 2 (two) times daily. (Patient not taking: Reported on 03/20/2015) 20 tablet 0  . ergocalciferol (VITAMIN D2) 50000 UNITS capsule Take 50,000 Units by mouth once a week. On tuesdays    . Hydrocodone-Acetaminophen  (VICODIN PO) Take 1 tablet by mouth every 4 (four) hours as needed (pain). Old prescription from when he had a tooth pulled    . oxyCODONE-acetaminophen (PERCOCET/ROXICET) 5-325 MG per tablet Take 2 tablets by mouth every 6 (six) hours as needed for severe pain. (Patient not taking: Reported on 03/20/2015) 20 tablet 0   Current Facility-Administered Medications on File Prior to Visit  Medication Dose Route Frequency Provider Last Rate Last Dose  . lidocaine (XYLOCAINE) 2 % injection 2 mL  2 mL Infiltration Once Jaclyn Shaggy, MD         Review of Systems General: negative for fever, weight loss, appetite change Eyes: no visual symptoms. ENT: no ear symptoms, no sinus tenderness, no nasal congestion or sore throat. Neck: no pain  Respiratory: no wheezing, shortness of breath, cough Cardiovascular: no chest pain, no dyspnea on exertion, no pedal edema, no orthopnea. Gastrointestinal: no abdominal pain, no diarrhea, no constipation Genito-Urinary: no urinary frequency, no dysuria, no polyuria, left inguinal hernia Hematologic: no bruising Endocrine: no cold or heat intolerance Neurological: no headaches, no seizures, no tremors Musculoskeletal: no joint pains, no joint swelling Skin: see hpi Psychological: no depression, no anxiety,      Objective: Filed Vitals:   03/27/15 1527  BP: 126/83  Pulse: 75  Temp: 98 F (36.7 C)  TempSrc: Oral  Resp: 18  Height: 6' (1.829 m)  Weight: 213 lb 9.6 oz (96.888 kg)  SpO2: 93%      Physical Exam Constitutional: normal appearing,  Neck: normal range of motion, no thyromegaly, no JVD Cardiovascular: normal rate and rhythm, normal heart sounds, no murmurs, rub or gallop, no pedal edema Respiratory: clear to auscultation bilaterally, no wheezes, no rales, no rhonchi Abdomen: soft, not tender to palpation, normal bowel sounds, no enlarged organs Extremities: Full ROM, no tenderness in joints Genito-Urinary: The left inguinoscrotal hernia which  is tender to palpation, it is also reducible. Skin: 3 x 4 cm sebaceous cyst in the mid back which is not tender to palpation with central punctum.         Assessment & Plan:  54 year old male with a left inguinoscrotal hernia and sebaceous cyst s/p I&D currently on Keflex.  Sebaceous cyst: Referred to general surgery for excision meanwhile and have advised him to complete his course of Keflex.  Left inguinoscrotal hernia: Referred to general surgery for repair. Sinensis a major limitation and he informs me he did not qualify for the [facility] card; I have discussed other resources available including [facility] and [facility] and the patient will look into this as well.

## 2015-04-17 ENCOUNTER — Ambulatory Visit: Payer: Self-pay | Attending: Internal Medicine

## 2015-04-29 ENCOUNTER — Ambulatory Visit: Payer: Self-pay | Admitting: Family Medicine

## 2015-10-08 ENCOUNTER — Encounter: Payer: Self-pay | Admitting: Family Medicine

## 2015-10-08 ENCOUNTER — Ambulatory Visit: Payer: Self-pay | Attending: Family Medicine | Admitting: Family Medicine

## 2015-10-08 VITALS — BP 147/83 | HR 95 | Temp 98.0°F | Resp 14 | Ht 72.0 in | Wt 225.0 lb

## 2015-10-08 DIAGNOSIS — Z131 Encounter for screening for diabetes mellitus: Secondary | ICD-10-CM

## 2015-10-08 DIAGNOSIS — K0889 Other specified disorders of teeth and supporting structures: Secondary | ICD-10-CM

## 2015-10-08 DIAGNOSIS — R030 Elevated blood-pressure reading, without diagnosis of hypertension: Secondary | ICD-10-CM

## 2015-10-08 DIAGNOSIS — IMO0001 Reserved for inherently not codable concepts without codable children: Secondary | ICD-10-CM

## 2015-10-08 LAB — POCT GLYCOSYLATED HEMOGLOBIN (HGB A1C): Hemoglobin A1C: 5.5

## 2015-10-08 MED ORDER — AMOXICILLIN 500 MG PO CAPS: 500.0000 mg | ORAL_CAPSULE | Freq: Three times a day (TID) | ORAL | Status: DC

## 2015-10-08 MED FILL — AMOXICILLIN 500 MG CAPSULE: 500 | 10 days supply | Qty: 30 | Fill #0

## 2015-10-08 NOTE — Progress Notes (Signed)
Subjective:  Patient ID: Louis Brown, male    DOB: February 10, 1961  Age: 55 y.o. MRN: [id number]  CC: Dental Pain   HPI Louis Brown presents for a 10 day history of left lower molar tooth ache at the site where he previously had a filed carvity  Which later fell off. He denies any abscess formation or fever and has no jaw pain. He  Would like a dental referral.  Outpatient Prescriptions Prior to Visit  Medication Sig Dispense Refill  . ergocalciferol (VITAMIN D2) 50000 UNITS capsule Take 50,000 Units by mouth once a week. On tuesdays    . amoxicillin-clavulanate (AUGMENTIN) 875-125 MG per tablet Take 1 tablet by mouth 2 (two) times daily. (Patient not taking: Reported on 03/20/2015) 20 tablet 0  . cephALEXin (KEFLEX) 500 MG capsule Take 1 capsule (500 mg total) by mouth 4 (four) times daily. 40 capsule 0  . diazepam (VALIUM) 5 MG tablet Take 1 tablet (5 mg total) by mouth 2 (two) times daily. (Patient not taking: Reported on 03/20/2015) 20 tablet 0  . Hydrocodone-Acetaminophen (VICODIN PO) Take 1 tablet by mouth every 4 (four) hours as needed (pain). Old prescription from when he had a tooth pulled    . oxyCODONE-acetaminophen (PERCOCET/ROXICET) 5-325 MG per tablet Take 2 tablets by mouth every 6 (six) hours as needed for severe pain. (Patient not taking: Reported on 03/20/2015) 20 tablet 0   Facility-Administered Medications Prior to Visit  Medication Dose Route Frequency Provider Last Rate Last Dose  . lidocaine (XYLOCAINE) 2 % injection 2 mL  2 mL Infiltration Once Jaclyn Shaggy, MD        ROS Review of Systems  Constitutional: Negative for activity change and appetite change.  HENT: Positive for dental problem. Negative for sinus pressure and sore throat.   Respiratory: Negative for chest tightness, shortness of breath and wheezing.   Cardiovascular: Negative for chest pain and palpitations.  Gastrointestinal: Negative for abdominal pain, constipation and abdominal distention.    Genitourinary: Negative.   Musculoskeletal: Negative.   Psychiatric/Behavioral: Negative for behavioral problems and dysphoric mood.    Objective:  BP 147/83 mmHg  Pulse 95  Temp(Src) 98 F (36.7 C)  Resp 14  Ht 6' (1.829 m)  Wt 225 lb (102.059 kg)  BMI 30.51 kg/m2  SpO2 95%  BP/Weight 10/08/2015 03/27/2015 03/20/2015  Systolic BP 147 126 121  Diastolic BP 83 83 84  Wt. (Lbs) 225 213.6 215  BMI 30.51 28.96 29.15      Physical Exam  Constitutional: He is oriented to person, place, and time. He appears well-developed and well-nourished.  HENT:  Mouth/Throat: Dental caries ( in left lower molar) present.  Cardiovascular: Normal rate, normal heart sounds and intact distal pulses.   No murmur heard. Pulmonary/Chest: Effort normal and breath sounds normal. He has no wheezes. He has no rales. He exhibits no tenderness.  Abdominal: Soft. Bowel sounds are normal. He exhibits no distension and no mass. There is no tenderness.  Musculoskeletal: Normal range of motion.  Neurological: He is alert and oriented to person, place, and time.     Assessment & Plan:   1. Diabetes mellitus screening A1 - HgB A1c  2. Tooth ache - amoxicillin (AMOXIL) 500 MG capsule; Take 1 capsule (500 mg total) by mouth 3 (three) times daily.  Dispense: 30 capsule; Refill: 0 - Ambulatory referral to Dentistry  3. Elevated blood pressure He has no known history of hypertension. Current elevation in blood pressures likely from pain.  Meds ordered this encounter  Medications  . amoxicillin (AMOXIL) 500 MG capsule    Sig: Take 1 capsule (500 mg total) by mouth 3 (three) times daily.    Dispense:  30 capsule    Refill:  0    Follow-up: Return if symptoms worsen or fail to improve.   Jaclyn Shaggy MD

## 2015-10-08 NOTE — Patient Instructions (Signed)

## 2015-10-08 NOTE — Progress Notes (Signed)
Left lower molar pain for 2 weeks

## 2017-04-24 ENCOUNTER — Emergency Department (HOSPITAL_COMMUNITY): Payer: Self-pay

## 2017-04-24 ENCOUNTER — Emergency Department (HOSPITAL_COMMUNITY)
Admission: EM | Admit: 2017-04-24 | Discharge: 2017-04-24 | Disposition: A | Discharge status: Home / Self Care | Payer: Self-pay | Attending: Emergency Medicine | Admitting: Emergency Medicine

## 2017-04-24 ENCOUNTER — Encounter (HOSPITAL_COMMUNITY): Payer: Self-pay | Admitting: Emergency Medicine

## 2017-04-24 ENCOUNTER — Other Ambulatory Visit: Payer: Self-pay

## 2017-04-24 DIAGNOSIS — Z79899 Other long term (current) drug therapy: Secondary | ICD-10-CM | POA: Insufficient documentation

## 2017-04-24 DIAGNOSIS — R42 Dizziness and giddiness: Secondary | ICD-10-CM | POA: Insufficient documentation

## 2017-04-24 LAB — COMPREHENSIVE METABOLIC PANEL
ALT: 27 U/L (ref 17–63)
ANION GAP: 8 (ref 5–15)
AST: 28 U/L (ref 15–41)
Albumin: 4.3 g/dL (ref 3.5–5.0)
Alkaline Phosphatase: 54 U/L (ref 38–126)
BUN: 12 mg/dL (ref 6–20)
CO2: 26 mmol/L (ref 22–32)
Calcium: 9.3 mg/dL (ref 8.9–10.3)
Chloride: 104 mmol/L (ref 101–111)
Creatinine, Ser: 1.11 mg/dL (ref 0.61–1.24)
GFR calc non Af Amer: >60 mL/min (ref >60)
Glucose, Bld: 109 mg/dL — ABNORMAL HIGH (ref 65–99)
POTASSIUM: 4.4 mmol/L (ref 3.5–5.1)
Sodium: 138 mmol/L (ref 135–145)
TOTAL PROTEIN: 7.1 g/dL (ref 6.5–8.1)
Total Bilirubin: 1.1 mg/dL (ref 0.3–1.2)

## 2017-04-24 LAB — DIFFERENTIAL
Basophils Absolute: 0.1 10*3/uL (ref 0.0–0.1)
Basophils Relative: 1 %
EOS ABS: 0.1 10*3/uL (ref 0.0–0.7)
EOS PCT: 2 %
Lymphocytes Relative: 34 %
Lymphs Abs: 2.1 10*3/uL (ref 0.7–4.0)
Monocytes Absolute: 0.6 10*3/uL (ref 0.1–1.0)
Monocytes Relative: 9 %
NEUTROS PCT: 54 %
Neutro Abs: 3.3 10*3/uL (ref 1.7–7.7)

## 2017-04-24 LAB — CBC
HCT: 44.5 % (ref 39.0–52.0)
Hemoglobin: 15.1 g/dL (ref 13.0–17.0)
MCH: 31.2 pg (ref 26.0–34.0)
MCHC: 33.9 g/dL (ref 30.0–36.0)
MCV: 91.9 fL (ref 78.0–100.0)
Platelets: 290 10*3/uL (ref 150–400)
RBC: 4.84 MIL/uL (ref 4.22–5.81)
RDW: 12.1 % (ref 11.5–15.5)
WBC: 6.1 10*3/uL (ref 4.0–10.5)

## 2017-04-24 LAB — I-STAT CHEM 8, ED
BUN: 16 mg/dL (ref 6–20)
CALCIUM ION: 1.19 mmol/L (ref 1.15–1.40)
CHLORIDE: 104 mmol/L (ref 101–111)
Creatinine, Ser: 1 mg/dL (ref 0.61–1.24)
GLUCOSE: 105 mg/dL — AB (ref 65–99)
HCT: 45 % (ref 39.0–52.0)
Hemoglobin: 15.3 g/dL (ref 13.0–17.0)
POTASSIUM: 4.4 mmol/L (ref 3.5–5.1)
SODIUM: 139 mmol/L (ref 135–145)
TCO2: 27 mmol/L (ref 22–32)

## 2017-04-24 LAB — RAPID URINE DRUG SCREEN, HOSP PERFORMED
AMPHETAMINES: NOT DETECTED
BENZODIAZEPINES: NOT DETECTED
Barbiturates: NOT DETECTED
Cocaine: NOT DETECTED
OPIATES: NOT DETECTED
TETRAHYDROCANNABINOL: NOT DETECTED

## 2017-04-24 LAB — I-STAT TROPONIN, ED: Troponin i, poc: 0 ng/mL (ref 0.00–0.08)

## 2017-04-24 LAB — CBG MONITORING, ED: GLUCOSE-CAPILLARY: 103 mg/dL — AB (ref 65–99)

## 2017-04-24 LAB — PROTIME-INR
INR: 0.98
PROTHROMBIN TIME: 13 s (ref 11.4–15.2)

## 2017-04-24 LAB — APTT: aPTT: 28 seconds (ref 24–36)

## 2017-04-24 LAB — CK: CK TOTAL: 276 U/L (ref 49–397)

## 2017-04-24 NOTE — ED Notes (Signed)
Patient transported to CT 

## 2017-04-24 NOTE — Discharge Instructions (Addendum)
You should also follow up with a heart doctor as an outpatient, please call and schedule an appointment. Please follow up with your primary doctor as outpatient, if you need a primary care doctor information for one has been provided below.

## 2017-04-24 NOTE — ED Notes (Signed)
Lab to add on CK 

## 2017-04-24 NOTE — ED Triage Notes (Signed)
Pt c/o dizziness that started yesterday while mowing yards-- mowed 14 yards-- "feels like I am on a boat"

## 2017-04-24 NOTE — ED Provider Notes (Signed)
Pt returned from MRI.  Mri is read as normal.   Pt counseled on low heart rate.  Pt advised of need to follow up with cardiology.  Pt reports he currently feels fine.     Elson Areas, PA-C 04/24/17 1856    Rolan Bucco, MD 04/24/17 [phone]

## 2017-04-24 NOTE — ED Provider Notes (Signed)
[facility]-EMERGENCY DEPT Provider Note   CSN: [id number] Arrival date & time: 04/24/17  2536     History   Chief Complaint Chief Complaint  Patient presents with  . Dizziness    HPI Louis Brown is a 56 y.o. male.  Patient is a 56 yo M who presents to the ED with new onset dizziness. States started yesterday morning at 6:20am with dizziness and lightheadedness "like on a swinging bridge" that has persisted. He became concerned because dizziness was prominent particularly while driving, worse with movement. Denies room spinning or vertigo. No chest pain or SOB. No nausea. Feels unsteady on feet. Has not had any weakness or falls. No vision changes.       Past Medical History:  Diagnosis Date  . Inguinal hernia     Patient Active Problem List   Diagnosis Date Noted  . Inguinal hernia 03/27/2015  . Sebaceous cyst 03/27/2015    Past Surgical History:  Procedure Laterality Date  . KNEE ARTHROSCOPY    . KNEE SURGERY         Home Medications    Prior to Admission medications   Medication Sig Start Date End Date Taking? Authorizing Provider  amoxicillin (AMOXIL) 500 MG capsule Take 1 capsule (500 mg total) by mouth 3 (three) times daily. 10/08/15   Jaclyn Shaggy, MD  ergocalciferol (VITAMIN D2) 50000 UNITS capsule Take 50,000 Units by mouth once a week. On tuesdays    [provider]    Family History Family History  Problem Relation Age of Onset  . Kidney disease Father     Social History Social History  Substance Use Topics  . Smoking status: Never Smoker  . Smokeless tobacco: Never Used  . Alcohol use Yes     Comment: social  + Marijuana and CBD supplement use    Allergies   Codeine   Review of Systems Review of Systems  Constitutional: Negative for chills and fever.  Eyes: Negative for visual disturbance.  Respiratory: Negative for shortness of breath.   Cardiovascular: Negative for chest pain and palpitations.  Gastrointestinal: Negative for  abdominal pain, constipation, diarrhea, nausea and vomiting.  Genitourinary: Negative for dysuria.  Neurological: Positive for dizziness and light-headedness. Negative for weakness, numbness and headaches.  Psychiatric/Behavioral: Negative for confusion.     Physical Exam Updated Vital Signs BP (!) 132/96 (BP Location: Left Arm)   Pulse 69   Temp 98.4 F (36.9 C) (Oral)   Resp 18   SpO2 99%    Orthostatic VS for the past 24 hrs:  BP- Lying Pulse- Lying BP- Sitting Pulse- Sitting BP- Standing at 0 minutes Pulse- Standing at 0 minutes  04/24/17 1200 127/81 (!) 45 (!) 145/101 60 (!) 145/98 59      Physical Exam  Constitutional: He is oriented to person, place, and time. He appears well-developed and well-nourished. No distress.  HENT:  Head: Normocephalic and atraumatic.  Nose: Nose normal.  Mouth/Throat: Oropharynx is clear and moist.  Eyes: Pupils are equal, round, and reactive to light. Conjunctivae and EOM are normal.  EOMI intact but possible vertical nystagmus. Visual acuity 20/20 with reading glasses.  Neck: Normal range of motion. Neck supple.  Cardiovascular: Normal rate, regular rhythm, normal heart sounds and intact distal pulses.   No murmur heard. Pulmonary/Chest: Effort normal and breath sounds normal. No respiratory distress.  Abdominal: Soft. Bowel sounds are normal. He exhibits no distension. There is no tenderness. There is no rebound and no guarding.  Musculoskeletal: Normal range of  motion. He exhibits no edema.  Lymphadenopathy:    He has no cervical adenopathy.  Neurological: He is alert and oriented to person, place, and time. He displays normal reflexes. No cranial nerve deficit or sensory deficit. He exhibits normal muscle tone. Coordination normal.  Intact to finger to nose. No pronator drift. Possible positive Romberg.  Skin: Skin is warm and dry. Capillary refill takes less than 2 seconds. He is not diaphoretic.  Psychiatric: He has a normal mood and  affect.     ED Treatments / Results  Labs (all labs ordered are listed, but only abnormal results are displayed) Labs Reviewed  COMPREHENSIVE METABOLIC PANEL - Abnormal; Notable for the following:       Result Value   Glucose, Bld 109 (*)    All other components within normal limits  CBG MONITORING, ED - Abnormal; Notable for the following:    Glucose-Capillary 103 (*)    All other components within normal limits  I-STAT CHEM 8, ED - Abnormal; Notable for the following:    Glucose, Bld 105 (*)    All other components within normal limits  PROTIME-INR  APTT  CBC  DIFFERENTIAL  CK  RAPID URINE DRUG SCREEN, HOSP PERFORMED  I-STAT TROPONIN, ED    EKG  EKG Interpretation  Date/Time:  Saturday April 24 2017 11:41:27 EDT Ventricular Rate:  55 PR Interval:  168 QRS Duration: 90 QT Interval:  410 QTC Calculation: 392 R Axis:   34 Text Interpretation:  Sinus bradycardia with sinus arrhythmia Otherwise normal ECG Confirmed by Lorre Nick ([id number]) on 04/24/2017 12:34:59 PM       Radiology No results found.  Procedures Procedures (including critical care time)  Medications Ordered in ED Medications - No data to display   Initial Impression / Assessment and Plan / ED Course  I have reviewed the triage vital signs and the nursing notes.  Pertinent labs & imaging results that were available during my care of the patient were reviewed by me and considered in my medical decision making (see chart for details).   Patient is a 56yo M who presented with new onset of dizziness that started acutely yesterday morning. Vital signs significant for bradycardia that occasionally down into the 40s. EKG showing sinus bradycardia. Patient is well appearing but has questionable vertical nystagmus and possible positive Romberg so MRI brain obtained to rule out stroke. Discussed with patient that if MRI negative then should follow up with cardiology as outpatient to work up for symptomatic  bradycardia which may possibly be linked to chronic THC use. Patient voiced good understanding of this plan. Signed out to Langston Masker PA-C to follow up on MRI results.   Final Clinical Impressions(s) / ED Diagnoses   Final diagnoses:  Dizziness    New Prescriptions New Prescriptions   No medications on file     Leland Her, DO 04/24/17 1552

## 2017-04-24 NOTE — ED Provider Notes (Signed)
I saw and evaluated the patient, reviewed the resident's note and I agree with the findings and plan.   EKG Interpretation  Date/Time:  Saturday April 24 2017 11:41:27 EDT Ventricular Rate:  55 PR Interval:  168 QRS Duration: 90 QT Interval:  410 QTC Calculation: 392 R Axis:   34 Text Interpretation:  Sinus bradycardia with sinus arrhythmia Otherwise normal ECG Confirmed by Lorre Nick ([id number]) on 04/24/2017 12:34:17 PM     56 year old male presents with acute onset of dizziness that is worse with certain positions which began yesterday. Patient also notes he feels off balance when he walks. Denies any headache. On physical exam, patient slightly ataxic when walking. Possible vertical nystagmus. Bring MRI ordered and pending at this time   Lorre Nick, MD 04/24/17 1301

## 2017-05-06 ENCOUNTER — Ambulatory Visit: Payer: Self-pay | Admitting: Cardiology

## 2017-05-07 ENCOUNTER — Ambulatory Visit (INDEPENDENT_AMBULATORY_CARE_PROVIDER_SITE_OTHER): Payer: Self-pay | Admitting: Cardiology

## 2017-05-07 ENCOUNTER — Encounter: Payer: Self-pay | Admitting: Cardiology

## 2017-05-07 VITALS — BP 138/90 | HR 70 | Ht 72.0 in | Wt 211.1 lb

## 2017-05-07 DIAGNOSIS — R42 Dizziness and giddiness: Secondary | ICD-10-CM | POA: Insufficient documentation

## 2017-05-07 DIAGNOSIS — R001 Bradycardia, unspecified: Secondary | ICD-10-CM | POA: Insufficient documentation

## 2017-05-07 NOTE — Progress Notes (Signed)
Cardiology Office Note:    Date:  05/07/2017   ID:  Louis Brown, DOB [birth year], MRN [id number]  PCP:  Patient, [name]  Cardiologist:  Garwin Brothersajan R Revankar, MD   Referring MD: No ref. provider found    ASSESSMENT:    1. Dizziness    PLAN:    In order of problems listed above:  1. In view of his symptoms I reviewed emergency room records and blood work and EKG and discussed this with him at extensive length and he vocalized understanding. Questions were answered to his satisfaction. 2. I will obtain a TSH in the next few days to assess his issues of bradycardia arrhythmias. I did not see any significant bradyarrhythmia in the emergency room monitoring. He is a very young healthy active gentleman and that may be a cause of his physiologic bradycardia. I told him that I will also do a lipid check while he gets a blood work and he is agreeable. 3. He will undergo stress echocardiogram testing to assess his son to rule out any coronary etiology. He'll be seen in follow-up appointment in 2 months or earlier if he has any concerns. He knows to go to the nearest emergency room for any significant symptoms.   Medication Adjustments/Labs and Tests Ordered: Current medicines are reviewed at length with the patient today.  Concerns regarding medicines are outlined above.  Orders Placed This Encounter  Procedures  . Lipid panel  . TSH  . Holter monitor - 48 hour  . ECHOCARDIOGRAM STRESS TEST   No orders of the defined types were placed in this encounter.    History of Present Illness:    Louis Brown is a 56 y.o. male who is being seen today for the evaluation for dizziness and bradycardia. Patient is a pleasant 56 year old male. He has no significant past medical history. He mentions to me that he felt dizzy and went to the emergency room. In the emergency room he was evaluated and overall this evaluation was unremarkable. He was noted to have mild bradycardia distress. No chest pain  orthopnea or PND. No history of any syncopal episodes. At the time of my evaluation is alert awake oriented and in no distress.  Past Medical History:  Diagnosis Date  . Inguinal hernia     Past Surgical History:  Procedure Laterality Date  . KNEE ARTHROSCOPY    . KNEE SURGERY      Current Medications: No outpatient prescriptions have been marked as taking for the 05/07/17 encounter (Office Visit) with Revankar, Aundra Dubinajan R, MD.     Allergies:   Codeine   Social History   Social History  . Marital status: Single    Spouse name: N/A  . Number of children: N/A  . Years of education: N/A   Social History Main Topics  . Smoking status: Never Smoker  . Smokeless tobacco: Never Used  . Alcohol use Yes     Comment: social  . Drug use: No     Comment: occ  . Sexual activity: Not Asked   Other Topics Concern  . None   Social History Narrative  . None     Family History: The patient's family history includes Arrhythmia in his mother; Irregular heart beat in his brother; Kidney disease in his father.  ROS:   Please see the history of present illness.    All other systems reviewed and are negative.  EKGs/Labs/Other Studies Reviewed:    The following studies were reviewed today: I  reviewed records from the emergency room extensively and discussed this with the patient at extensive length. Questions were answered to his satisfaction.   Recent Labs: 04/24/2017: ALT 27; BUN 16; Creatinine, Ser 1.00; Hemoglobin 15.3; Platelets 290; Potassium 4.4; Sodium 139  Recent Lipid Panel    Component Value Date/Time   CHOL 207 (H) 11/07/2013 1024   TRIG 269 (H) 11/07/2013 1024   HDL 44 11/07/2013 1024   CHOLHDL 4.7 11/07/2013 1024   VLDL 54 (H) 11/07/2013 1024   LDLCALC 109 (H) 11/07/2013 1024    Physical Exam:    VS:  BP 138/90   Pulse 70   Ht 6' (1.829 m)   Wt 211 lb 1.9 oz (95.8 kg)   SpO2 98%   BMI 28.63 kg/m     Wt Readings from Last 3 Encounters:  05/07/17 211 lb  1.9 oz (95.8 kg)  10/08/15 225 lb (102.1 kg)  03/27/15 213 lb 9.6 oz (96.9 kg)     GEN: Patient is in no acute distress HEENT: Normal NECK: No JVD; No carotid bruits LYMPHATICS: No lymphadenopathy CARDIAC: S1 S2 regular, 2/6 systolic murmur at the apex. RESPIRATORY:  Clear to auscultation without rales, wheezing or rhonchi  ABDOMEN: Soft, non-tender, non-distended MUSCULOSKELETAL:  No edema; No deformity  SKIN: Warm and dry NEUROLOGIC:  Alert and oriented x 3 PSYCHIATRIC:  Normal affect    Signed, Garwin Brothers, MD  05/07/2017 11:19 AM    [facility]

## 2017-05-07 NOTE — Patient Instructions (Signed)
Medication Instructions:  Your physician recommends that you continue on your current medications as directed. Please refer to the Current Medication list given to you today.   Labwork: Your physician recommends that you return for lab work in: When you go for your testing.    Testing/Procedures:  Your physician has requested that you have a stress echocardiogram. For further information please visit [email or web address]. Please follow instruction sheet as given.  Your physician has recommended that you wear a holter monitor. Holter monitors are medical devices that record the heart's electrical activity. Doctors most often use these monitors to diagnose arrhythmias. Arrhythmias are problems with the speed or rhythm of the heartbeat. The monitor is a small, portable device. You can wear one while you do your normal daily activities. This is usually used to diagnose what is causing palpitations/syncope (passing out).  These test will take place at our main office at [address], [place], [place]   Follow-Up:  Your physician recommends that you schedule a follow-up appointment in: 3 months with Dr. Tomie Chinaevankar.   Any Other Special Instructions Will Be Listed Below (If Applicable).     If you need a refill on your cardiac medications before your next appointment, please call your pharmacy.

## 2017-06-10 ENCOUNTER — Telehealth (HOSPITAL_COMMUNITY): Payer: Self-pay

## 2017-06-10 NOTE — Telephone Encounter (Signed)
Pt hung up on me. No instructions given. Will try to call him back. S.Geniece Akers EMTP

## 2017-06-16 ENCOUNTER — Other Ambulatory Visit (HOSPITAL_COMMUNITY): Payer: Self-pay

## 2017-06-16 ENCOUNTER — Ambulatory Visit (HOSPITAL_COMMUNITY): Payer: Self-pay | Attending: Cardiovascular Disease

## 2017-06-16 ENCOUNTER — Ambulatory Visit (INDEPENDENT_AMBULATORY_CARE_PROVIDER_SITE_OTHER): Payer: Self-pay

## 2017-06-16 ENCOUNTER — Ambulatory Visit (HOSPITAL_COMMUNITY): Payer: Self-pay

## 2017-06-16 ENCOUNTER — Other Ambulatory Visit: Payer: Self-pay

## 2017-06-16 DIAGNOSIS — R42 Dizziness and giddiness: Secondary | ICD-10-CM

## 2017-06-16 LAB — ECHOCARDIOGRAM STRESS TEST
FS: 31 % (ref 28–44)
IVS/LV PW RATIO, ED: 0.94
LA ID, A-P, ES: 33 mm
LADIAMINDEX: 1.51 cm/m2
LEFT ATRIUM END SYS DIAM: 33 mm
LV PW d: 11.6 mm — AB (ref 0.6–1.1)

## 2017-06-23 ENCOUNTER — Telehealth: Payer: Self-pay | Admitting: Surgery

## 2017-06-24 ENCOUNTER — Telehealth: Payer: Self-pay

## 2017-06-24 NOTE — Telephone Encounter (Signed)
Noted  

## 2017-06-24 NOTE — Telephone Encounter (Addendum)
Message received from Michel BickersWandalyn Rogers, RN CM requesting this CM contact the patient about medical follow up.  Call placed to the patient. He has not [facility] since [facility].  He was seen in the ED on 04/24/17 for dizziness and said that he had a thorough work up at that time and was referred to cardiology.  He explained that he has seen a cardiologist and had a stress test and has been told that he they don't think " it's my heart."  He said that he continues to experience 24/7 dizziness and has contacted cardiology by phone as well as through My Chart but  has not received a response that he is satisfied with.  He denied the need to go to the ED at this time.    Encouraged him to call cardiology again to discuss his concerns. Explained to him that there are no appointments [facility]  at this time; but he can contact [facility] to schedule an appointment as they have appointments available - Provided him with the contact #.  Also explained to him about Financial Counseling and SW assistance that are available at [facility] as well as the use for the [employer] and [employer] Financial Assistance.   He said that he was given an " application" when he was at the hospital and is not quite sure what it is for or what to do next.  Encouraged him to call [facility] to schedule an appointment and to also schedule an appointment for Financial Counseling to assist with [employer] and [employer] Financial Assistance applications.   He said that he does not have any pharmacy expenses as he is not taking any medications.   Instructed him to call this CM if he has any on-going questions.   Update provided to Michel BickersWandalyn Rogers, RN CM

## 2017-06-30 ENCOUNTER — Ambulatory Visit: Payer: Self-pay | Attending: Internal Medicine | Admitting: Internal Medicine

## 2017-06-30 VITALS — BP 130/88 | HR 78 | Temp 97.7°F | Resp 14 | Ht 72.0 in | Wt 220.2 lb

## 2017-06-30 DIAGNOSIS — R413 Other amnesia: Secondary | ICD-10-CM | POA: Insufficient documentation

## 2017-06-30 DIAGNOSIS — Z885 Allergy status to narcotic agent status: Secondary | ICD-10-CM | POA: Insufficient documentation

## 2017-06-30 DIAGNOSIS — R42 Dizziness and giddiness: Secondary | ICD-10-CM | POA: Insufficient documentation

## 2017-06-30 DIAGNOSIS — H9313 Tinnitus, bilateral: Secondary | ICD-10-CM | POA: Insufficient documentation

## 2017-06-30 NOTE — Progress Notes (Signed)
Patient complains about being tired and dizzy when he is doing an activities.

## 2017-06-30 NOTE — Patient Instructions (Signed)
Transient Global Amnesia Transient global amnesia causes a sudden and temporary (transient) loss of memory (amnesia). While you may recall memories from your distant past, including being able to recognize people you know well, you may not recall things that happened more recently in the past days, months, or even year. A transient global amnesia episode does not last longer than 24 hours. Transient global amnesia does not affect your other brain functions. Your memory usually returns to normal after an episode is over. One episode of transient global amnesia does not make you more likely to have a stroke, a relapse, or other complications. What are the causes? The cause of this condition is not known. What increases the risk? Transient global amnesia is more likely to develop in people who:  Are 12-29 years old.  Have a history of migraine headaches.  What are the signs or symptoms? The main symptoms of this condition include:  The inability to remember recent events.  Asking repetitive questions about the situation and surroundings and not recalling the answers to these questions.  Other symptoms include:  Restlessness and nervousness.  Confusion.  Headaches.  Dizziness.  Nausea.  How is this diagnosed? Your health care provider may suspect transient global amnesia based on your symptoms. Your health care provider will do a physical exam. This may include a test to check your mental abilities (cognitive evaluation). You may also have imaging studies done to check your brain function. These may include:  Electroencephalography (EEG).  Diffusion-weighted imaging (DWI).  MRI.  How is this treated? There is no treatment for this condition. An episode typically goes away on its own after a few hours. If you also have a seizure or migraine during an episode, you will receive treatment for these conditions. This may include medicines. Follow these instructions at home:  Take  medicines only as directed by your health care provider.  Tell your family or friends that you have transient global amnesia. Ask them to help you avoid physical exertion, including sexual intercourse, swimming, and straining while holding your breath (Valsalva maneuver), until the episode passes. These are events that can bring on transient global amnesia attacks. Contact a health care provider if:  You have a migraine and it does not go away after you have followed your treatment plan for this condition.  You have a seizure for the first time, or a seizure that is different from seizures you normally have.  You experience transient global amnesia repeatedly. This information is not intended to replace advice given to you by your health care provider. Make sure you discuss any questions you have with your health care provider. Document Released: 09/24/2004 Document Revised: 04/20/2016 Document Reviewed: 05/02/2014 Elsevier Interactive Patient Education  2018 [employer]. Dizziness Dizziness is a common problem. It is a feeling of unsteadiness or light-headedness. You may feel like you are about to faint. Dizziness can lead to injury if you stumble or fall. Anyone can become dizzy, but dizziness is more common in older adults. This condition can be caused by a number of things, including medicines, dehydration, or illness. Follow these instructions at home: Taking these steps may help with your condition: Eating and drinking  Drink enough fluid to keep your urine clear or pale yellow. This helps to keep you from becoming dehydrated. Try to drink more clear fluids, such as water.  Do not drink alcohol.  Limit your caffeine intake if directed by your health care provider.  Limit your salt intake if directed  by your health care provider. Activity  Avoid making quick movements. ? Rise slowly from chairs and steady yourself until you feel okay. ? In the morning, first sit up on the side of  the bed. When you feel okay, stand slowly while you hold onto something until you know that your balance is fine.  Move your legs often if you need to stand in one place for a long time. Tighten and relax your muscles in your legs while you are standing.  Do not drive or operate heavy machinery if you feel dizzy.  Avoid bending down if you feel dizzy. Place items in your home so that they are easy for you to reach without leaning over. Lifestyle  Do not use any tobacco products, including cigarettes, chewing tobacco, or electronic cigarettes. If you need help quitting, ask your health care provider.  Try to reduce your stress level, such as with yoga or meditation. Talk with your health care provider if you need help. General instructions  Watch your dizziness for any changes.  Take medicines only as directed by your health care provider. Talk with your health care provider if you think that your dizziness is caused by a medicine that you are taking.  Tell a friend or a family member that you are feeling dizzy. If he or she notices any changes in your behavior, have this person call your health care provider.  Keep all follow-up visits as directed by your health care provider. This is important. Contact a health care provider if:  Your dizziness does not go away.  Your dizziness or light-headedness gets worse.  You feel nauseous.  You have reduced hearing.  You have new symptoms.  You are unsteady on your feet or you feel like the room is spinning. Get help right away if:  You vomit or have diarrhea and are unable to eat or drink anything.  You have problems talking, walking, swallowing, or using your arms, hands, or legs.  You feel generally weak.  You are not thinking clearly or you have trouble forming sentences. It may take a friend or family member to notice this.  You have chest pain, abdominal pain, shortness of breath, or sweating.  Your vision changes.  You  notice any bleeding.  You have a headache.  You have neck pain or a stiff neck.  You have a fever. This information is not intended to replace advice given to you by your health care provider. Make sure you discuss any questions you have with your health care provider. Document Released: 02/10/2001 Document Revised: 01/23/2016 Document Reviewed: 08/13/2014 Elsevier Interactive Patient Education  2017 [employer]

## 2017-06-30 NOTE — Progress Notes (Signed)
Louis Foremanric Simonetti, is a 56 y.o. male  ZHY:[id number]  EXB:[id number]  DOB - [birth year]  Chief Complaint  Patient presents with  . Dizziness      Subjective:   Louis Brown is a 56 y.o. male with no significant past medical history until few months ago when he developed dizziness, he presents here today for ED visit follow up. Patient presented to the ED on 04/24/2017 with acute onset of dizziness worse with certain positions which began a day prior. Patient also noted he was off balance when he walked. On physical exam in ED, patient was slightly ataxic when walking with possible vertical nystagmus. MRI brain and Head CT were normal. Patient was referred to cardiologist for further work-ups, Holter monitor did not reveal much, stress test was normal.  He is here today to re-establish medical care and to continue work-up for dizziness. This has persisted and now constant, he also complain of occasional ringing in his ears bilaterally. He denies headache or blurry vision. Denies double vision. He claims he is having facial amnesia, that people will come to him and say " Wynona Caneshey Macario, good to see you again", but he would nor recall ever meeting such person, could not recognize faces anymore. Patient has not seen any other specialists. He does not want to start any medication until proper diagnosis is made. No fall. No head injury.   ALLERGIES: Allergies  Allergen Reactions  . Codeine Itching   PAST MEDICAL HISTORY: Past Medical History:  Diagnosis Date  . Inguinal hernia     MEDICATIONS AT HOME: Prior to Admission medications   Not on File    Objective:   Vitals:   06/30/17 0916  BP: 130/88  Pulse: 78  Resp: 14  Temp: 97.7 F (36.5 C)  TempSrc: Oral  SpO2: 98%  Weight: 220 lb 3.2 oz (99.9 kg)  Height: 6' (1.829 m)   Exam General appearance : Awake, alert, not in any distress. Speech Clear. Not toxic looking HEENT: Atraumatic and Normocephalic, pupils equally reactive to light  and accomodation Neck: Supple, no JVD. No cervical lymphadenopathy.  Chest: Good air entry bilaterally, no added sounds  CVS: S1 S2 regular, no murmurs.  Abdomen: Bowel sounds present, Non tender and not distended with no gaurding, rigidity or rebound. Extremities: B/L Lower Ext shows no edema, both legs are warm to touch Neurology: Awake alert, and oriented X 3, CN II-XII intact, Non focal Skin: No Rash  Data Review Lab Results  Component Value Date   HGBA1C 5.50 10/08/2015    Assessment & Plan   1. Dizziness and giddiness  - TSH - Lipid panel - Catecholamines, Fractionated, Plasma - Cortisol - Ambulatory referral to ENT - Ambulatory referral to Neurology  2. Transient amnesia  - Ambulatory referral to Neurology  3. Ringing in ear, bilateral - Patient does not want to try any medication for now - Ambulatory referral to ENT  Patient have been counseled extensively about nutrition and exercise. Other issues discussed during this visit include: low cholesterol diet, weight control and daily exercise.  Return in about 6 months (around 12/28/2017) for Dizziness.  The patient was given clear instructions to go to ER or return to medical center if symptoms don't improve, worsen or new problems develop. The patient verbalized understanding. The patient was told to call to get lab results if they haven't heard anything in the next week.   This note has been created with Education officer, environmentalDragon speech recognition software and smart phrase technology. Any  transcriptional errors are unintentional.    Jeanann Lewandowsky, MD, MHA, FACP, FAAP, CPE [facility] [place], [place] [phone]   06/30/2017, 10:25 AM

## 2017-07-01 ENCOUNTER — Telehealth: Payer: Self-pay

## 2017-07-01 NOTE — Telephone Encounter (Signed)
Informed patient of his normal monitor results; he did not have any further questions or concerns.

## 2017-07-02 ENCOUNTER — Encounter: Payer: Self-pay | Admitting: Neurology

## 2017-07-06 LAB — LIPID PANEL
CHOL/HDL RATIO: 4.5 ratio (ref 0.0–5.0)
Cholesterol, Total: 225 mg/dL — ABNORMAL HIGH (ref 100–199)
HDL: 50 mg/dL (ref >39)
LDL CALC: 145 mg/dL — AB (ref 0–99)
TRIGLYCERIDES: 150 mg/dL — AB (ref 0–149)
VLDL CHOLESTEROL CAL: 30 mg/dL (ref 5–40)

## 2017-07-06 LAB — CATECHOLAMINES, FRACTIONATED, PLASMA
Dopamine: <30 pg/mL (ref 0–48)
EPINEPHRINE: 24 pg/mL (ref 0–62)
NOREPINEPHRINE: 384 pg/mL (ref 0–874)

## 2017-07-06 LAB — CORTISOL: Cortisol: 6 ug/dL

## 2017-07-06 LAB — TSH: TSH: 0.916 u[IU]/mL (ref 0.450–4.500)

## 2017-07-13 ENCOUNTER — Telehealth (INDEPENDENT_AMBULATORY_CARE_PROVIDER_SITE_OTHER): Payer: Self-pay | Admitting: *Deleted

## 2017-07-13 ENCOUNTER — Ambulatory Visit: Payer: Self-pay

## 2017-07-13 NOTE — Telephone Encounter (Signed)
Medical Assistant left message on patient's home and cell voicemail. Voicemail states to give a call back to [place] with [facility] at [phone]. Patient is aware of no results supporting the dizziness. Patient has been referred to ENT and Neurology. Patient is also aware of cholesterol level being high and needing to limit saturated fat intake and increase fiber and exercise.

## 2017-07-13 NOTE — Telephone Encounter (Signed)
-----   Message from Quentin Angstlugbemiga E Jegede, MD sent at 07/04/2017  1:05 PM EST ----- Please inform patient that his lab results did not show why he is having dizziness, already referred to ENT and Neurologist. Cholesterol level is high, To address this please limit saturated fat to no more than 7% of your calories, limit cholesterol to 200 mg/day, increase fiber and exercise as tolerated. If needed we may add cholesterol lowering medication to your regimen.

## 2017-07-21 ENCOUNTER — Telehealth: Payer: Self-pay | Admitting: Family Medicine

## 2017-07-21 NOTE — Telephone Encounter (Signed)
Pt called to speak with Lafonda Mossesiana.Routed message to her

## 2017-07-28 ENCOUNTER — Telehealth: Payer: Self-pay | Admitting: Cardiology

## 2017-07-28 NOTE — Telephone Encounter (Signed)
Spoke with patient regarding his concerns. He stated that his issues are not getting better that they are getting worse. He cannot walk to grab firewood without becoming short of breath. He stated that he can feel his blood pressure spike when he starts to do any physical activity (patient did not have logged bp recordings). He cannot be seen by his neurologist until January; he states that he has asked to be seen sooner without any success. Per the patient he is "begging for someone to help him." An appointment was made for tomorrow.

## 2017-07-28 NOTE — Telephone Encounter (Signed)
Patient states that he is feeling well and having chest pain and increased htn when he exercises and he cant get in to see the neurologist until Feb.. He is scheduled with Revankar in Dec but feels like "something is killing him". Please call him ..Marland Kitchen

## 2017-07-29 ENCOUNTER — Ambulatory Visit (INDEPENDENT_AMBULATORY_CARE_PROVIDER_SITE_OTHER): Payer: Self-pay | Admitting: Cardiology

## 2017-07-29 ENCOUNTER — Encounter: Payer: Self-pay | Admitting: Cardiology

## 2017-07-29 VITALS — BP 152/78 | HR 100 | Ht 72.0 in | Wt 222.4 lb

## 2017-07-29 DIAGNOSIS — R0609 Other forms of dyspnea: Secondary | ICD-10-CM

## 2017-07-29 NOTE — Patient Instructions (Signed)
Medication Instructions:  Your physician recommends that you continue on your current medications as directed. Please refer to the Current Medication list given to you today.  Labwork: None  Testing/Proceders: Your physician has requested that you have cardiac CT. Cardiac computed tomography (CT) is a painless test that uses an x-ray machine to take clear, detailed pictures of your heart. For further information please visit [email or web address]. Please follow instruction sheet as given.   Follow-Up: Your physician recommends that you schedule a follow-up appointment in: 6 months   Any Other Special Instructions Will Be Listed Below (If Applicable).     If you need a refill on your cardiac medications before your next appointment, please call your pharmacy.   [facility]  Garey HamAshley A, RN, BSN

## 2017-07-29 NOTE — Progress Notes (Signed)
Cardiology Office Note:    Date:  07/29/2017   ID:  Louis ForemanEric Brown, DOB Sep 20, 1960, MRN [id number]  PCP:  Louis ShaggyAmao, Enobong, MD  Cardiologist:  Garwin Brothersajan Brown Revankar, MD   Referring MD: Louis ShaggyAmao, Enobong, MD    ASSESSMENT:    1. DOE (dyspnea on exertion)    PLAN:    In order of problems listed above:  1. Primary prevention stressed with the patient.  Importance of compliance with diet and medication stressed.  His blood pressure is stable he has an element of whitecoat hypertension.  I think in view of his concerning symptoms and the fact that it is affecting his quality of life I suggested coronary CT angiography with the possibility of FFR if felt necessary.  Procedure, benefits and potential risks were explained to him and he vocalized understanding and is agreeable.  Further recommendations will be made based on the findings of the test.  He knows to go to the nearest emergency room for any significant concerns.   Medication Adjustments/Labs and Tests Ordered: Current medicines are reviewed at length with the patient today.  Concerns regarding medicines are outlined above.  Orders Placed This Encounter  Procedures  . CT CORONARY MORPH W/CTA COR W/SCORE W/CA W/CM &/OR WO/CM  . CT CORONARY FRACTIONAL FLOW RESERVE DATA PREP  . CT CORONARY FRACTIONAL FLOW RESERVE FLUID ANALYSIS  . EKG 12-Lead   No orders of the defined types were placed in this encounter.    Chief Complaint  Patient presents with  . Follow-up  . Shortness of Breath    DOE this has gotten worse sine the testing.   . Chest Pain    for the past 10 days      History of Present Illness:    Louis Foremanric Brown is a 56 y.o. male.  He was evaluated by me for dizziness.  His evaluation including a stress echo and Holter monitoring was unremarkable.  He mentions to me that he has multiple medical.  He is under evaluation by primary care and neurology.  He mentions to me that in addition to the problems he has numbness in the face and  some numb sensation in the chest on exertion.  No orthopnea or PND.  I asked him whether he was sexually active and he said he was in sexual activity did not bring about the symptoms.  At the time of my evaluation he is alert awake oriented and in no distress.  Past Medical History:  Diagnosis Date  . Inguinal hernia     Past Surgical History:  Procedure Laterality Date  . KNEE ARTHROSCOPY    . KNEE SURGERY      Current Medications: No outpatient medications have been marked as taking for the 07/29/17 encounter (Office Visit) with Revankar, Louis Dubinajan R, MD.     Allergies:   Codeine   Social History   Socioeconomic History  . Marital status: Single    Spouse name: None  . Number of children: None  . Years of education: None  . Highest education level: None  Social Needs  . Financial resource strain: None  . Food insecurity - worry: None  . Food insecurity - inability: None  . Transportation needs - medical: None  . Transportation needs - non-medical: None  Occupational History  . None  Tobacco Use  . Smoking status: Never Smoker  . Smokeless tobacco: Never Used  Substance and Sexual Activity  . Alcohol use: Yes    Comment: social  . Drug use:  No    Comment: occ  . Sexual activity: None  Other Topics Concern  . None  Social History Narrative  . None     Family History: The patient's family history includes Arrhythmia in his mother; Irregular heart beat in his brother; Kidney disease in his father.  ROS:   Please see the history of present illness.    All other systems reviewed and are negative.  EKGs/Labs/Other Studies Reviewed:    The following studies were reviewed today: I discussed my findings of the Holter monitor and stress echo report with him at extensive length and he vocalized understanding.   Recent Labs: 04/24/2017: ALT 27; BUN 16; Creatinine, Ser 1.00; Hemoglobin 15.3; Platelets 290; Potassium 4.4; Sodium 139 06/30/2017: TSH 0.916  Recent Lipid  Panel    Component Value Date/Time   CHOL 225 (H) 06/30/2017 1029   TRIG 150 (H) 06/30/2017 1029   HDL 50 06/30/2017 1029   CHOLHDL 4.5 06/30/2017 1029   CHOLHDL 4.7 11/07/2013 1024   VLDL 54 (H) 11/07/2013 1024   LDLCALC 145 (H) 06/30/2017 1029    Physical Exam:    VS:  BP (!) 152/78   Pulse 100   Ht 6' (1.829 m)   Wt 222 lb 6.4 oz (100.9 kg)   SpO2 98%   BMI 30.16 kg/m     Wt Readings from Last 3 Encounters:  07/29/17 222 lb 6.4 oz (100.9 kg)  06/30/17 220 lb 3.2 oz (99.9 kg)  05/07/17 211 lb 1.9 oz (95.8 kg)     GEN: Patient is in no acute distress HEENT: Normal NECK: No JVD; No carotid bruits LYMPHATICS: No lymphadenopathy CARDIAC: Hear sounds regular, 2/6 systolic murmur at the apex. RESPIRATORY:  Clear to auscultation without rales, wheezing or rhonchi  ABDOMEN: Soft, non-tender, non-distended MUSCULOSKELETAL:  No edema; No deformity  SKIN: Warm and dry NEUROLOGIC:  Alert and oriented x 3 PSYCHIATRIC:  Normal affect   Signed, Garwin Brothersajan Brown Revankar, MD  07/29/2017 10:36 AM    [facility]

## 2017-08-06 ENCOUNTER — Ambulatory Visit: Payer: Self-pay | Admitting: Cardiology

## 2017-08-09 ENCOUNTER — Ambulatory Visit: Payer: Self-pay | Admitting: Cardiology

## 2017-09-08 ENCOUNTER — Ambulatory Visit: Payer: Self-pay | Attending: Family Medicine | Admitting: Physician Assistant

## 2017-09-08 VITALS — BP 130/90 | HR 70 | Temp 98.0°F | Resp 16 | Wt 228.4 lb

## 2017-09-08 DIAGNOSIS — R42 Dizziness and giddiness: Secondary | ICD-10-CM | POA: Insufficient documentation

## 2017-09-08 DIAGNOSIS — R232 Flushing: Secondary | ICD-10-CM | POA: Insufficient documentation

## 2017-09-08 DIAGNOSIS — L749 Eccrine sweat disorder, unspecified: Secondary | ICD-10-CM

## 2017-09-08 DIAGNOSIS — R61 Generalized hyperhidrosis: Secondary | ICD-10-CM | POA: Insufficient documentation

## 2017-09-08 DIAGNOSIS — Z885 Allergy status to narcotic agent status: Secondary | ICD-10-CM | POA: Insufficient documentation

## 2017-09-08 DIAGNOSIS — R5383 Other fatigue: Secondary | ICD-10-CM | POA: Insufficient documentation

## 2017-09-08 DIAGNOSIS — R413 Other amnesia: Secondary | ICD-10-CM | POA: Insufficient documentation

## 2017-09-08 NOTE — Progress Notes (Signed)
Patient ID: Louis Brown, male   DOB: Aug 03, 1961, 57 y.o.   MRN: [id number]     Louis Brown, is a 57 y.o. male  WUJ:[id number]  [id number]:[id number]  DOB - Apr 05, 1961  Subjective:  Chief Complaint and HPI: Louis Brown is a 57 y.o. male here today for "major concerns."  I feel like something is really wrong and I am going to die."  Hasn't felt well for a couple of years.  Symptoms progressively worsening.    Episodes of dizziness/feeling off balance.  Some facial flushing Aug 24-25th ED visit.  Multiple testing.  Saw cardiology. Multiple scans, stress treadmill.  Denies any current CP/N/V.  No SOB.    Also c/o memory issues.  Forgets where he parks.  Forgets which floor of a multi-story building he is on.  Has neurology appt in February.    Sweating when tries to work. Sweating and flushing when he eats.  He is worried about "adrenal cancer." No abdominal pain.  ED/Hospital notes reviewed.   Social History:  Not working, has a daughter   ROS:   Constitutional:  No f/c, No night sweats, No unexplained weight loss. EENT:  No vision changes, No blurry vision, No hearing changes. No mouth, throat, or ear problems.  Respiratory: No cough, No SOB Cardiac: No CP, no palpitations GI:  No abd pain, No N/V/D. GU: No Urinary s/sx Musculoskeletal: No joint pain Neuro: No headache, + dizziness, no motor weakness.  Skin: No rash Endocrine:  No polydipsia. No polyuria.  Psych: Denies SI/HI  No problems updated.  ALLERGIES: Allergies  Allergen Reactions  . Codeine Itching    PAST MEDICAL HISTORY: Past Medical History:  Diagnosis Date  . Inguinal hernia     MEDICATIONS AT HOME: Prior to Admission medications   Not on File     Objective:  EXAM:   Vitals:   09/08/17 1028  BP: 130/90  Pulse: 70  Resp: 16  Temp: 98 F (36.7 C)  TempSrc: Oral  SpO2: 95%  Weight: 228 lb 6.4 oz (103.6 kg)    General appearance : A&OX3. NAD. Non-toxic-appearing HEENT: Atraumatic and  Normocephalic.  PERRLA. EOM intact.  TM clear B. Mouth-MMM, post pharynx WNL w/o erythema, No PND. Neck: supple, no JVD. No cervical lymphadenopathy. No thyromegaly Chest/Lungs:  Breathing-non-labored, Good air entry bilaterally, breath sounds normal without rales, rhonchi, or wheezing  CVS: S1 S2 regular, no murmurs, gallops, rubs  Abdomen: Bowel sounds present, Non tender and not distended with no gaurding, rigidity or rebound. Extremities: Bilateral Lower Ext shows no edema, both legs are warm to touch with = pulse throughout Neurology:  CN II-XII grossly intact, Non focal.  Facial and extremity symmetry intact.  Heel to shin, finger to noses, heel to toe walking, tip toe walking all intact.  Rhomberg is negative Psych:  TP linear. J/I WNL. Normal speech. Appropriate eye contact and affect.  Skin:  No Rash  Data Review Lab Results  Component Value Date   HGBA1C 5.50 10/08/2015     Assessment & Plan   1. Sweating abnormality - Catecholamines, Fractionated, Plasma were normal and per UTD, no additional work up for that would be needed  2. Flushing - Catecholamines, Fractionated, Plasma were normal and per UTD, no additional work up for that would be needed  3. Fatigue, unspecified type - Testosterone, % free  4. Dizziness - Vitamin D, 25-hydroxy - Testosterone  Keep all f/up appts.  Extensive work-ups have been initiated. To ED if anything changes/worsens Spent >45  mins face to face.    Patient have been counseled extensively about nutrition and exercise  Return in about 4 weeks (around 10/06/2017) for Dr Alvis LemmingsNewlin for recheck-sweating/memory issues.  The patient was given clear instructions to go to ER or return to medical center if symptoms don't improve, worsen or new problems develop. The patient verbalized understanding. The patient was told to call to get lab results if they haven't heard anything in the next week.     Georgian CoAngela Adraine Biffle, PA-C [facility]  and [facility] [place], [place] [phone]   09/08/2017, 10:49 AM

## 2017-09-09 ENCOUNTER — Other Ambulatory Visit: Payer: Self-pay | Admitting: Physician Assistant

## 2017-09-09 DIAGNOSIS — E559 Vitamin D deficiency, unspecified: Secondary | ICD-10-CM

## 2017-09-09 MED ORDER — VITAMIN D (ERGOCALCIFEROL) 1.25 MG (50000 UNIT) PO CAPS: 50000.0000 [IU] | ORAL_CAPSULE | ORAL | 0 refills | Status: DC

## 2017-09-09 MED FILL — VIT D2 1.25 MG (50,000 UNIT: 1.25 MG | 28 days supply | Qty: 4 | Fill #0

## 2017-09-13 LAB — TESTOSTERONE, % FREE: TESTOSTERONE-% FREE: 2.1 %

## 2017-09-13 LAB — VITAMIN D 25 HYDROXY (VIT D DEFICIENCY, FRACTURES): Vit D, 25-Hydroxy: 13 ng/mL — ABNORMAL LOW (ref 30.0–100.0)

## 2017-09-13 LAB — TESTOSTERONE: TESTOSTERONE: 260 ng/dL — AB (ref 264–916)

## 2017-09-14 ENCOUNTER — Other Ambulatory Visit: Payer: Self-pay | Admitting: Family Medicine

## 2017-09-14 ENCOUNTER — Other Ambulatory Visit (HOSPITAL_COMMUNITY): Payer: Self-pay | Admitting: Interventional Radiology

## 2017-09-14 ENCOUNTER — Ambulatory Visit (HOSPITAL_COMMUNITY)
Admission: RE | Admit: 2017-09-14 | Discharge: 2017-09-14 | Disposition: A | Discharge status: Home / Self Care | Payer: Self-pay | Source: Ambulatory Visit | Attending: Cardiology | Admitting: Cardiology

## 2017-09-14 ENCOUNTER — Ambulatory Visit (HOSPITAL_COMMUNITY): Payer: Self-pay

## 2017-09-14 DIAGNOSIS — R42 Dizziness and giddiness: Secondary | ICD-10-CM

## 2017-09-14 DIAGNOSIS — R0609 Other forms of dyspnea: Secondary | ICD-10-CM

## 2017-09-14 DIAGNOSIS — R911 Solitary pulmonary nodule: Secondary | ICD-10-CM | POA: Insufficient documentation

## 2017-09-14 DIAGNOSIS — R269 Unspecified abnormalities of gait and mobility: Secondary | ICD-10-CM

## 2017-09-14 DIAGNOSIS — H539 Unspecified visual disturbance: Secondary | ICD-10-CM

## 2017-09-14 DIAGNOSIS — R5383 Other fatigue: Secondary | ICD-10-CM

## 2017-09-14 MED ORDER — METOPROLOL TARTRATE 5 MG/5ML IV SOLN
5.0000 mg | INTRAVENOUS | Status: DC | PRN
  Administered 2017-09-14: 5 mg via INTRAVENOUS

## 2017-09-14 MED ORDER — NITROGLYCERIN 0.4 MG SL SUBL
0.8000 mg | SUBLINGUAL_TABLET | Freq: Once | SUBLINGUAL | Status: AC
  Administered 2017-09-14: 0.8 mg via SUBLINGUAL
  Filled 2017-09-14: qty 25

## 2017-09-14 MED ORDER — IOPAMIDOL (ISOVUE-370) INJECTION 76%
INTRAVENOUS | Status: AC
  Administered 2017-09-14: 80 mL via INTRAVENOUS
  Filled 2017-09-14: qty 100

## 2017-09-14 MED ORDER — METOPROLOL TARTRATE 5 MG/5ML IV SOLN
INTRAVENOUS | Status: AC
  Filled 2017-09-14: qty 15

## 2017-09-14 MED ORDER — NITROGLYCERIN 0.4 MG SL SUBL
SUBLINGUAL_TABLET | SUBLINGUAL | Status: AC
  Administered 2017-09-14: 0.8 mg via SUBLINGUAL
  Filled 2017-09-14: qty 2

## 2017-09-15 ENCOUNTER — Telehealth: Payer: Self-pay

## 2017-09-15 ENCOUNTER — Telehealth: Payer: Self-pay | Admitting: Cardiology

## 2017-09-15 NOTE — Telephone Encounter (Addendum)
Contacted pt to go over lab results pt is aware of results and doesn't have any questions or concerns   Pt stated stop telling him to exercise because he can not exercise because when he does his bp goes into stroke level I informed pt that I am just reading his lab results. Pt states well stop telling him to exercise and hung up

## 2017-09-15 NOTE — Telephone Encounter (Signed)
Louis Brown would like results of his tests

## 2017-09-16 ENCOUNTER — Ambulatory Visit (INDEPENDENT_AMBULATORY_CARE_PROVIDER_SITE_OTHER): Payer: Self-pay | Admitting: Cardiology

## 2017-09-16 ENCOUNTER — Ambulatory Visit (HOSPITAL_COMMUNITY)
Admission: RE | Admit: 2017-09-16 | Discharge: 2017-09-16 | Disposition: A | Discharge status: Home / Self Care | Payer: Self-pay | Source: Ambulatory Visit | Attending: Family Medicine | Admitting: Family Medicine

## 2017-09-16 ENCOUNTER — Encounter: Payer: Self-pay | Admitting: Cardiology

## 2017-09-16 DIAGNOSIS — R269 Unspecified abnormalities of gait and mobility: Secondary | ICD-10-CM

## 2017-09-16 DIAGNOSIS — H539 Unspecified visual disturbance: Secondary | ICD-10-CM

## 2017-09-16 DIAGNOSIS — R42 Dizziness and giddiness: Secondary | ICD-10-CM

## 2017-09-16 DIAGNOSIS — E782 Mixed hyperlipidemia: Secondary | ICD-10-CM | POA: Insufficient documentation

## 2017-09-16 DIAGNOSIS — R5383 Other fatigue: Secondary | ICD-10-CM

## 2017-09-16 HISTORY — PX: IR RADIOLOGIST EVAL & MGMT: IMG5224

## 2017-09-16 NOTE — Patient Instructions (Addendum)
Medication Instructions:  Your physician recommends that you continue on your current medications as directed. Please refer to the Current Medication list given to you today.  Labwork: None  Testing/Procedures: None  Follow-Up: Your physician recommends that you schedule a follow-up appointment in: as needed if symptoms worsen or fail to improve  Any Other Special Instructions Will Be Listed Below (If Applicable).     If you need a refill on your cardiac medications before your next appointment, please call your pharmacy.   [facility]  Garey HamAshley A, RN, BSN

## 2017-09-16 NOTE — Progress Notes (Signed)
Cardiology Office Note:    Date:  09/16/2017   ID:  Louis ForemanEric Cathey, DOB 07-22-1961, MRN [id number]  PCP:  Jaclyn ShaggyAmao, Enobong, MD  Cardiologist:  Garwin Brothersajan R Vaudie Engebretsen, MD   Referring MD: Jaclyn ShaggyAmao, Enobong, MD    ASSESSMENT:    No diagnosis found. PLAN:    In order of problems listed above:  1. Secondary prevention stressed with the patient.  Coated baby aspirin on a regular basis 81 mg.  With him that he needs to talk to his primary care physician about statin therapy in view of now established coronary calcification.  He vocalized understanding and questions were answered to his satisfaction.  He will be discharged from our care to the excellent care of his primary care physician.  We do make a service available should there be any questions in his cardiovascular management.   Medication Adjustments/Labs and Tests Ordered: Current medicines are reviewed at length with the patient today.  Concerns regarding medicines are outlined above.  No orders of the defined types were placed in this encounter.  No orders of the defined types were placed in this encounter.    Chief Complaint  Patient presents with  . Follow-up     History of Present Illness:    Louis Foremanric Hilyer is a 57 y.o. male.  Patient was evaluated for symptoms of dizziness.  He underwent coronary angiography there was only mild plaque formation and up to 20-25% stenosis in his coronary arteries.  Subsequently he has had no symptoms from a cardiovascular standpoint.  The patient is here to discuss these results today. Past Medical History:  Diagnosis Date  . Inguinal hernia     Past Surgical History:  Procedure Laterality Date  . KNEE ARTHROSCOPY    . KNEE SURGERY      Current Medications: Current Meds  Medication Sig  . Vitamin D, Ergocalciferol, (DRISDOL) 50000 units CAPS capsule Take 1 capsule (50,000 Units total) by mouth every 7 (seven) days.     Allergies:   Codeine   Social History   Socioeconomic History  .  Marital status: Single    Spouse name: Brown  . Number of children: Brown  . Years of education: Brown  . Highest education level: Brown  Social Needs  . Financial resource strain: Brown  . Food insecurity - worry: Brown  . Food insecurity - inability: Brown  . Transportation needs - medical: Brown  . Transportation needs - non-medical: Brown  Occupational History  . Brown  Tobacco Use  . Smoking status: Never Smoker  . Smokeless tobacco: Never Used  Substance and Sexual Activity  . Alcohol use: Yes    Comment: social  . Drug use: No    Comment: occ  . Sexual activity: Brown  Other Topics Concern  . Brown  Social History Narrative  . Brown     Family History: The patient's family history includes Arrhythmia in his mother; Irregular heart beat in his brother; Kidney disease in his father.  ROS:   Please see the history of present illness.    All other systems reviewed and are negative.  EKGs/Labs/Other Studies Reviewed:    The following studies were reviewed today: Coronary angiography report and lipids with the patient at extensive length.   Recent Labs: 04/24/2017: ALT 27; BUN 16; Creatinine, Ser 1.00; Hemoglobin 15.3; Platelets 290; Potassium 4.4; Sodium 139 06/30/2017: TSH 0.916  Recent Lipid Panel    Component Value Date/Time   CHOL 225 (H) 06/30/2017 1029   TRIG 150 (H)  06/30/2017 1029   HDL 50 06/30/2017 1029   CHOLHDL 4.5 06/30/2017 1029   CHOLHDL 4.7 11/07/2013 1024   VLDL 54 (H) 11/07/2013 1024   LDLCALC 145 (H) 06/30/2017 1029    Physical Exam:    VS:  BP (!) 140/94 (BP Location: Left Arm, Patient Position: Sitting, Cuff Size: Normal)   Pulse 94   Ht 6' (1.829 m)   Wt 225 lb (102.1 kg)   SpO2 98%   BMI 30.52 kg/m     Wt Readings from Last 3 Encounters:  09/16/17 225 lb (102.1 kg)  09/08/17 228 lb 6.4 oz (103.6 kg)  07/29/17 222 lb 6.4 oz (100.9 kg)     GEN: Patient is in no acute distress HEENT: Normal NECK: No JVD; No carotid bruits LYMPHATICS:  No lymphadenopathy CARDIAC: Hear sounds regular, 2/6 systolic murmur at the apex. RESPIRATORY:  Clear to auscultation without rales, wheezing or rhonchi  ABDOMEN: Soft, non-tender, non-distended MUSCULOSKELETAL:  No edema; No deformity  SKIN: Warm and dry NEUROLOGIC:  Alert and oriented x 3 PSYCHIATRIC:  Normal affect   Signed, Garwin Brothers, MD  09/16/2017 12:10 PM    [facility]

## 2017-09-16 NOTE — Telephone Encounter (Signed)
Spoke with patient.

## 2017-09-17 ENCOUNTER — Ambulatory Visit (HOSPITAL_COMMUNITY): Payer: Self-pay

## 2017-09-17 ENCOUNTER — Encounter (HOSPITAL_COMMUNITY): Payer: Self-pay | Admitting: Interventional Radiology

## 2017-09-21 ENCOUNTER — Other Ambulatory Visit (HOSPITAL_COMMUNITY): Payer: Self-pay | Admitting: Interventional Radiology

## 2017-09-21 DIAGNOSIS — R42 Dizziness and giddiness: Secondary | ICD-10-CM

## 2017-09-21 DIAGNOSIS — R5383 Other fatigue: Secondary | ICD-10-CM

## 2017-09-21 DIAGNOSIS — H539 Unspecified visual disturbance: Secondary | ICD-10-CM

## 2017-09-21 DIAGNOSIS — R269 Unspecified abnormalities of gait and mobility: Secondary | ICD-10-CM

## 2017-09-27 ENCOUNTER — Ambulatory Visit (HOSPITAL_COMMUNITY)
Admission: RE | Admit: 2017-09-27 | Discharge: 2017-09-27 | Disposition: A | Discharge status: Home / Self Care | Payer: Self-pay | Source: Ambulatory Visit | Attending: Interventional Radiology | Admitting: Interventional Radiology

## 2017-09-27 DIAGNOSIS — I6523 Occlusion and stenosis of bilateral carotid arteries: Secondary | ICD-10-CM | POA: Insufficient documentation

## 2017-09-27 DIAGNOSIS — R269 Unspecified abnormalities of gait and mobility: Secondary | ICD-10-CM | POA: Insufficient documentation

## 2017-09-27 DIAGNOSIS — H539 Unspecified visual disturbance: Secondary | ICD-10-CM | POA: Insufficient documentation

## 2017-09-27 DIAGNOSIS — R42 Dizziness and giddiness: Secondary | ICD-10-CM | POA: Insufficient documentation

## 2017-09-27 DIAGNOSIS — R5383 Other fatigue: Secondary | ICD-10-CM | POA: Insufficient documentation

## 2017-09-27 NOTE — Progress Notes (Signed)
Carotid artery duplex has been completed. 1-39% ICA stenosis bilaterally.  09/27/17 9:25 AM Olen CordialGreg Faydra Korman RVT

## 2017-09-28 ENCOUNTER — Telehealth (HOSPITAL_COMMUNITY): Payer: Self-pay

## 2017-09-28 NOTE — Telephone Encounter (Signed)
Pt agreed to f/u in 6 months with us carotid. AW 

## 2017-10-05 ENCOUNTER — Encounter: Payer: Self-pay | Admitting: Family Medicine

## 2017-10-05 ENCOUNTER — Ambulatory Visit: Payer: Self-pay | Attending: Family Medicine | Admitting: Family Medicine

## 2017-10-05 VITALS — BP 109/74 | HR 86 | Temp 98.1°F | Ht 72.0 in | Wt 223.2 lb

## 2017-10-05 DIAGNOSIS — R232 Flushing: Secondary | ICD-10-CM

## 2017-10-05 DIAGNOSIS — R42 Dizziness and giddiness: Secondary | ICD-10-CM | POA: Insufficient documentation

## 2017-10-05 DIAGNOSIS — R5383 Other fatigue: Secondary | ICD-10-CM | POA: Insufficient documentation

## 2017-10-05 DIAGNOSIS — E782 Mixed hyperlipidemia: Secondary | ICD-10-CM | POA: Insufficient documentation

## 2017-10-05 DIAGNOSIS — Z9889 Other specified postprocedural states: Secondary | ICD-10-CM | POA: Insufficient documentation

## 2017-10-05 DIAGNOSIS — R911 Solitary pulmonary nodule: Secondary | ICD-10-CM | POA: Insufficient documentation

## 2017-10-05 DIAGNOSIS — Z885 Allergy status to narcotic agent status: Secondary | ICD-10-CM | POA: Insufficient documentation

## 2017-10-05 MED ORDER — ATORVASTATIN CALCIUM 20 MG PO TABS: 20.0000 mg | ORAL_TABLET | Freq: Every day | ORAL | 3 refills | Status: AC

## 2017-10-05 MED ORDER — MECLIZINE HCL 25 MG PO TABS: 25.0000 mg | ORAL_TABLET | Freq: Three times a day (TID) | ORAL | 0 refills | Status: DC | PRN

## 2017-10-05 MED FILL — ?MECLIZINE 25MG TAB: 25 | 10 days supply | Qty: 30 | Fill #0

## 2017-10-05 MED FILL — ?ATORVASTATIN 20 MG TABLET: 20 | 30 days supply | Qty: 30 | Fill #0

## 2017-10-05 NOTE — Patient Instructions (Signed)

## 2017-10-05 NOTE — Progress Notes (Signed)
Subjective:  Patient ID: Louis Brown, male    DOB: 08-01-1961  Age: 57 y.o. MRN: [id number]  CC: Dizziness  HPI Louis Brown is a 57 year old male who presents today with complaints of dizziness since 10/2016 which he describes as "walking on a swinging bridge" and is increased with activity and worse in the mornings.  He denies sense of the room spinning, tinnitus or vertigo and endorses associated fatigue, memory loss, dyspnea, diaphoresis facial flushing, and has noticed that whenever he has facial flushing his BP has been high.  Dizziness is unrelated to position; he denies headaches or blurry vision. He informed me he recently had a stress test where his blood pressure rose to the 200s systolics and the test had to be terminated. "Something is killing me and no one can tell me what is wrong"he states.  He has had several workups for same during hospitalization in 03/2017.   CT of the head without contrast and MRI of the brain were unrevealing with no explanation for symptoms. Seen by cardiology in 07/2017, coronary CT angiography revealed calcium score 6, mildly diffuse calcified plaque in the proximal and mid LAD with a 25-50% stenosis, aggressive risk factor modification recommended. It also revealed the presence of a right middle lobe pulmonary nodule. Holter monitor was unremarkable. Seen by interventional radiology and carotid dopplers ordered.  I have reviewed his labs and catecholamines, cortisol level were negative He informs me today he does not want to treat symptoms but just wants to find out the underlying cause. He has an upcoming appointment with neurology at the end of the month.  Past Medical History:  Diagnosis Date  . Inguinal hernia     Past Surgical History:  Procedure Laterality Date  . IR RADIOLOGIST EVAL & MGMT  09/16/2017  . KNEE ARTHROSCOPY    . KNEE SURGERY      Allergies  Allergen Reactions  . Codeine Itching     Outpatient Medications Prior to  Visit  Medication Sig Dispense Refill  . Vitamin D, Ergocalciferol, (DRISDOL) 50000 units CAPS capsule Take 1 capsule (50,000 Units total) by mouth every 7 (seven) days. (Patient not taking: Reported on 10/05/2017) 16 capsule 0   No facility-administered medications prior to visit.     ROS Review of Systems  Constitutional: Positive for fatigue. Negative for activity change and appetite change.  HENT: Negative for sinus pressure and sore throat.   Eyes: Negative for visual disturbance.  Respiratory: Positive for shortness of breath. Negative for cough and chest tightness.   Cardiovascular: Negative for chest pain and leg swelling.  Gastrointestinal: Negative for abdominal distention, abdominal pain, constipation and diarrhea.  Endocrine: Negative.   Genitourinary: Negative for dysuria.  Musculoskeletal: Negative for joint swelling and myalgias.  Skin: Negative for rash.  Allergic/Immunologic: Negative.   Neurological: Positive for light-headedness. Negative for weakness and numbness.  Psychiatric/Behavioral: Negative for dysphoric mood and suicidal ideas.    Objective:  BP 109/74   Pulse 86   Temp 98.1 F (36.7 C) (Oral)   Ht 6' (1.829 m)   Wt 223 lb 3.2 oz (101.2 kg)   SpO2 98%   BMI 30.27 kg/m   BP/Weight 10/05/2017 09/16/2017 09/14/2017  Systolic BP 109 140 131  Diastolic BP 74 94 94  Wt. (Lbs) 223.2 225 -  BMI 30.27 30.52 -      Physical Exam  Constitutional: He is oriented to person, place, and time. He appears well-developed and well-nourished.  Cardiovascular: Normal rate, normal heart sounds  and intact distal pulses.  No murmur heard. Pulmonary/Chest: Effort normal and breath sounds normal. He has no wheezes. He has no rales. He exhibits no tenderness.  Abdominal: Soft. Bowel sounds are normal. He exhibits no distension and no mass. There is no tenderness.  Musculoskeletal: Normal range of motion.  Neurological: He is alert and oriented to person, place, and time.   Skin: Skin is warm and dry.  Psychiatric: He has a normal mood and affect.     Assessment & Plan:   1. Mixed dyslipidemia Commenced on statin in the light of coronary plaque seen on coronary CT angiogram Low-cholesterol diet - atorvastatin (LIPITOR) 20 MG tablet; Take 1 tablet (20 mg total) by mouth daily.  Dispense: 30 tablet; Refill: 3  2. Solitary pulmonary nodule Right pulmonary nodule seen on coronary CT angio We will obtain formal CT given previous history of smoking - CT CHEST NODULE FOLLOW UP LOW DOSE W/O; Future  3. Flushing Unknown etiology We will send of plasma catecholamines and also order abdominal imaging to exclude adrenal tumors - CT Abdomen Pelvis W Contrast; Future - Metanephrines, plasma - Catecholamines, Fractionated, Plasma  4. Dizziness We will treat presumptively for vertigo We have checked him for orthostatics and he is negative here in the clinic and we walked him around the clinic and the highest his blood pressure has risen to is 142/88 Keep appointment with neurology - meclizine (ANTIVERT) 25 MG tablet; Take 1 tablet (25 mg total) by mouth 3 (three) times daily as needed for dizziness.  Dispense: 30 tablet; Refill: 0 - CT Abdomen Pelvis W Contrast; Future  5. Other fatigue We will evaluate for underlying metabolic causes - VITAMIN D 25 Hydroxy (Vit-D Deficiency, Fractures) - CBC with Differential/Platelet - TSH   Meds ordered this encounter  Medications  . meclizine (ANTIVERT) 25 MG tablet    Sig: Take 1 tablet (25 mg total) by mouth 3 (three) times daily as needed for dizziness.    Dispense:  30 tablet    Refill:  0  . atorvastatin (LIPITOR) 20 MG tablet    Sig: Take 1 tablet (20 mg total) by mouth daily.    Dispense:  30 tablet    Refill:  3    Follow-up: No Follow-up on file.   Hoy Register MD

## 2017-10-06 ENCOUNTER — Encounter: Payer: Self-pay | Admitting: Family Medicine

## 2017-10-08 NOTE — Telephone Encounter (Signed)
ED CM received call from patient regarding [facility], ED CM explained that he would have to call the clinic. CM also sent message to [facility] CM.

## 2017-10-09 LAB — CBC WITH DIFFERENTIAL/PLATELET
BASOS: 1 %
Basophils Absolute: 0.1 10*3/uL (ref 0.0–0.2)
EOS (ABSOLUTE): 0.6 10*3/uL — AB (ref 0.0–0.4)
EOS: 9 %
Hematocrit: 45.1 % (ref 37.5–51.0)
Hemoglobin: 15.4 g/dL (ref 13.0–17.7)
IMMATURE GRANS (ABS): 0 10*3/uL (ref 0.0–0.1)
IMMATURE GRANULOCYTES: 0 %
Lymphocytes Absolute: 1.8 10*3/uL (ref 0.7–3.1)
Lymphs: 26 %
MCH: 32 pg (ref 26.6–33.0)
MCHC: 34.1 g/dL (ref 31.5–35.7)
MCV: 94 fL (ref 79–97)
MONOCYTES: 8 %
Monocytes Absolute: 0.6 10*3/uL (ref 0.1–0.9)
Neutrophils Absolute: 4 10*3/uL (ref 1.4–7.0)
Neutrophils: 56 %
PLATELETS: 315 10*3/uL (ref 150–379)
RBC: 4.81 x10E6/uL (ref 4.14–5.80)
RDW: 13.1 % (ref 12.3–15.4)
WBC: 7.1 10*3/uL (ref 3.4–10.8)

## 2017-10-09 LAB — CATECHOLAMINES, FRACTIONATED, PLASMA
EPINEPHRINE: 29 pg/mL (ref 0–62)
Norepinephrine: 292 pg/mL (ref 0–874)

## 2017-10-09 LAB — TSH: TSH: 1.12 u[IU]/mL (ref 0.450–4.500)

## 2017-10-09 LAB — METANEPHRINES, PLASMA
Metanephrine, Free: 30 pg/mL (ref 0–62)
NORMETANEPHRINE FREE: 90 pg/mL (ref 0–145)

## 2017-10-09 LAB — VITAMIN D 25 HYDROXY (VIT D DEFICIENCY, FRACTURES): VIT D 25 HYDROXY: 28 ng/mL — AB (ref 30.0–100.0)

## 2017-10-11 ENCOUNTER — Other Ambulatory Visit: Payer: Self-pay | Admitting: Family Medicine

## 2017-10-14 ENCOUNTER — Ambulatory Visit (HOSPITAL_COMMUNITY)
Admission: RE | Admit: 2017-10-14 | Discharge: 2017-10-14 | Disposition: A | Discharge status: Home / Self Care | Payer: Self-pay | Source: Ambulatory Visit | Attending: Family Medicine | Admitting: Family Medicine

## 2017-10-14 ENCOUNTER — Encounter (HOSPITAL_COMMUNITY): Payer: Self-pay

## 2017-10-14 DIAGNOSIS — R911 Solitary pulmonary nodule: Secondary | ICD-10-CM

## 2017-10-14 DIAGNOSIS — N400 Enlarged prostate without lower urinary tract symptoms: Secondary | ICD-10-CM | POA: Insufficient documentation

## 2017-10-14 DIAGNOSIS — M899 Disorder of bone, unspecified: Secondary | ICD-10-CM | POA: Insufficient documentation

## 2017-10-14 DIAGNOSIS — I2510 Atherosclerotic heart disease of native coronary artery without angina pectoris: Secondary | ICD-10-CM | POA: Insufficient documentation

## 2017-10-14 DIAGNOSIS — R232 Flushing: Secondary | ICD-10-CM | POA: Insufficient documentation

## 2017-10-14 DIAGNOSIS — K4090 Unilateral inguinal hernia, without obstruction or gangrene, not specified as recurrent: Secondary | ICD-10-CM | POA: Insufficient documentation

## 2017-10-14 DIAGNOSIS — R42 Dizziness and giddiness: Secondary | ICD-10-CM | POA: Insufficient documentation

## 2017-10-14 MED ORDER — BARIUM SULFATE 2.1 % PO SUSP
ORAL | Status: AC
  Filled 2017-10-14: qty 2

## 2017-10-14 MED ORDER — IOPAMIDOL (ISOVUE-300) INJECTION 61%
INTRAVENOUS | Status: AC
  Administered 2017-10-14: 100 mL
  Filled 2017-10-14: qty 100

## 2017-10-18 ENCOUNTER — Telehealth: Payer: Self-pay | Admitting: Family Medicine

## 2017-10-18 ENCOUNTER — Ambulatory Visit (INDEPENDENT_AMBULATORY_CARE_PROVIDER_SITE_OTHER): Payer: Self-pay | Admitting: Neurology

## 2017-10-18 ENCOUNTER — Encounter: Payer: Self-pay | Admitting: Neurology

## 2017-10-18 ENCOUNTER — Other Ambulatory Visit: Payer: Self-pay

## 2017-10-18 VITALS — BP 122/90 | HR 93 | Ht 72.0 in | Wt 225.4 lb

## 2017-10-18 DIAGNOSIS — I951 Orthostatic hypotension: Secondary | ICD-10-CM

## 2017-10-18 DIAGNOSIS — R42 Dizziness and giddiness: Secondary | ICD-10-CM

## 2017-10-18 DIAGNOSIS — R911 Solitary pulmonary nodule: Secondary | ICD-10-CM

## 2017-10-18 DIAGNOSIS — R413 Other amnesia: Secondary | ICD-10-CM

## 2017-10-18 NOTE — Telephone Encounter (Signed)
Patient was called and informed that he will be called once CT has been resulted.

## 2017-10-18 NOTE — Patient Instructions (Addendum)
We will get MRI of brain with contrast  We have sent a referral to [facility] for your [facility] and they will call you directly to schedule your appt. They are located at [address] If you need to contact them directly please call [phone].   We will check B12 level  Your provider has requested that you have labwork completed today. Please go to [facility] ([id number]) on the second floor of this building before leaving the office today. You do not need to check in. If you are not called within 15 minutes please check with the front desk.   We will order neurocognitive testing if covered Follow up afterward

## 2017-10-18 NOTE — Progress Notes (Signed)
NEUROLOGY CONSULTATION NOTE  Louis Brown MRN: [id number] DOB: September 19, 1960  Referring provider: Dr. Hyman HopesJegede Primary care provider: Dr. Alvis LemmingsNewlin  Reason for consult:  Dizziness, memory deficits  HISTORY OF PRESENT ILLNESS: Louis Brown is a 57 year old right-handed male with no significant past medical history who presents for dizziness and facial amnesia.  History supplemented by ED and PCP notes.   Since early 2018, he reports not feeling well.  He reports feeling fatigued and experiencing joint pains.  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  On 04/24/17, he presented to the ED with acute onset of dizziness which started on the previous day.  He denied spinning sensation.  He noted sensation of feeling off balance with undulating sensation and sometimes a lightheadedness.  There was no associated headache, visual disturbance or unilateral numbness and weakness.  CT and MRI of brain without contrast were personally reviewed and were normal.  He was evaluated by cardiology.  Holter monitor, stress test and carotid ultrasound were unremarkable.    He continues to not feel well.  He particularly feels ill with increased exertion, after walking for even short period of time.  He does feel dizzy while driving, particularly when he makes sharp turns.  When ambulating, he feels like veering towards the left, particularly when turning quickly.  He reports intermittent ringing in his right ear.  With prolonged exertion, he reports problems with memory.  He once went into [employer] for 10 minutes and then forgot where he parked his car.  He sometimes forgets when his daughter is scheduled for dance class.  Over Christmas, he was staying at a hotel and got confused about which button to press in the elevator.  Sometimes, he will not recognize people he knows casually, but denies problems with face recognition of people he has known for years.    During  his coronary CT angiogram in January, he was noted to have a lung nodule.  CT of abdomen and pelvis showed small sclerotic lesions throughout the pelvic bones.  TSH and cortisol were normal.  Testosterone was only borderline low.    PAST MEDICAL HISTORY: Past Medical History:  Diagnosis Date  . Inguinal hernia     PAST SURGICAL HISTORY: Past Surgical History:  Procedure Laterality Date  . IR RADIOLOGIST EVAL & MGMT  09/16/2017  . KNEE ARTHROSCOPY    . KNEE SURGERY      MEDICATIONS: Current Outpatient Medications on File Prior to Visit  Medication Sig Dispense Refill  . atorvastatin (LIPITOR) 20 MG tablet Take 1 tablet (20 mg total) by mouth daily. (Patient not taking: Reported on 10/18/2017) 30 tablet 3  . meclizine (ANTIVERT) 25 MG tablet Take 1 tablet (25 mg total) by mouth 3 (three) times daily as needed for dizziness. (Patient not taking: Reported on 10/18/2017) 30 tablet 0  . Vitamin D, Ergocalciferol, (DRISDOL) 50000 units CAPS capsule Take 1 capsule (50,000 Units total) by mouth every 7 (seven) days. (Patient not taking: Reported on 10/05/2017) 16 capsule 0   No current facility-administered medications on file prior to visit.     ALLERGIES: Allergies  Allergen Reactions  . Codeine Itching    FAMILY HISTORY: Family History  Problem Relation Age of Onset  . Kidney disease Father   . Arrhythmia Mother   . Irregular heart beat Brother     SOCIAL HISTORY: Social History   Socioeconomic History  . Marital status: Single  Spouse name: Not on file  . Number of children: Not on file  . Years of education: Not on file  . Highest education level: Not on file  Social Needs  . Financial resource strain: Not on file  . Food insecurity - worry: Not on file  . Food insecurity - inability: Not on file  . Transportation needs - medical: Not on file  . Transportation needs - non-medical: Not on file  Occupational History  . Not on file  Tobacco Use  . Smoking status:  Never Smoker  . Smokeless tobacco: Never Used  Substance and Sexual Activity  . Alcohol use: Yes    Comment: social  . Drug use: No    Comment: occ  . Sexual activity: Not on file  Other Topics Concern  . Not on file  Social History Narrative  . Not on file    REVIEW OF SYSTEMS: Constitutional: No fevers, chills, or sweats, no generalized fatigue, change in appetite Eyes: No visual changes, double vision, eye pain Ear, nose and throat: No hearing loss, ear pain, nasal congestion, sore throat Cardiovascular: No chest pain, palpitations Respiratory:  No shortness of breath at rest or with exertion, wheezes GastrointestinaI: No nausea, vomiting, diarrhea, abdominal pain, fecal incontinence Genitourinary:  No dysuria, urinary retention or frequency Musculoskeletal:  No neck pain, back pain Integumentary: No rash, pruritus, skin lesions Neurological: as above Psychiatric: No depression, insomnia, anxiety Endocrine: No palpitations, fatigue, diaphoresis, mood swings, change in appetite, change in weight, increased thirst Hematologic/Lymphatic:  No purpura, petechiae. Allergic/Immunologic: no itchy/runny eyes, nasal congestion, recent allergic reactions, rashes  PHYSICAL EXAM: Vitals:   10/18/17 0957  BP: 122/90  Pulse: 93  SpO2: 97%   General: No acute distress.  Patient appears well-groomed.  Anxious. Head:  Normocephalic/atraumatic Eyes:  fundi examined but not visualized Neck: supple, no paraspinal tenderness, full range of motion Back: No paraspinal tenderness Heart: regular rate and rhythm Lungs: Clear to auscultation bilaterally. Vascular: No carotid bruits. Neurological Exam: Mental status: alert and oriented to person, place, and time, delayed recall 2 of 5 words, remote memory intact, fund of knowledge intact, attention and concentration intact, speech fluent and not dysarthric, language intact.  Visuospatial and executive function intact (copied cube in wrong  direction). Montreal Cognitive Assessment  10/18/2017  Visuospatial/ Executive (0/5) 4  Naming (0/3) 3  Attention: Read list of digits (0/2) 2  Attention: Read list of letters (0/1) 1  Attention: Serial 7 subtraction starting at 100 (0/3) 3  Language: Repeat phrase (0/2) 2  Language : Fluency (0/1) 1  Abstraction (0/2) 2  Delayed Recall (0/5) 2  Orientation (0/6) 6  Total 26  Adjusted Score (based on education) 26   Cranial nerves: CN I: not tested CN II: pupils equal, round and reactive to light, visual fields intact CN III, IV, VI:  full range of motion, no nystagmus, no ptosis CN V: facial sensation intact CN VII: upper and lower face symmetric CN VIII: hearing intact CN IX, X: gag intact, uvula midline CN XI: sternocleidomastoid and trapezius muscles intact CN XII: tongue midline Bulk & Tone: normal, no fasciculations. Motor:  5/5 throughout  Sensation: temperature and vibration sensation intact. Deep Tendon Reflexes:  2+ throughout, toes downgoing.  Finger to nose testing:  Without dysmetria.  Heel to shin:  Without dysmetria.  Gait:  Normal station and stride.  Able to turn and tandem walk. Romberg negative.  IMPRESSION: Subjective dizziness.  Memory deficits Orthostatic hypotension  Symptoms are not definitive  for a primary neurologic etiology.  He exhibits other systemic symptoms such as diaphoresis, fatigue, arthralgias which may warrant further investigation of other system conditions.  He was orthostatic today, which may contribute to his presenting symptoms.  He also lacks lateralizing exam findings.  However, given the lung nodule (which may be of no clinical significance), we will check MRI of brain WITH contrast to rule out any abnormal enhancement which may suggest CNS malignancy.  We will also check B12 and neurocognitive testing.    Thank you for allowing me to take part in the care of this patient.  Shon Millet, DO  CC:  Hoy Register, MD  Quentin Angst, MD

## 2017-10-18 NOTE — Telephone Encounter (Signed)
Patient called and requested for ct results.

## 2017-10-19 ENCOUNTER — Telehealth: Payer: Self-pay

## 2017-10-19 LAB — TIQ-NTM

## 2017-10-19 LAB — VITAMIN B12: Vitamin B-12: 447 pg/mL (ref 200–1100)

## 2017-10-19 NOTE — Telephone Encounter (Signed)
-----   Message from Drema DallasAdam R Jaffe, DO sent at 10/19/2017  8:26 AM EST ----- B12 level is normal

## 2017-10-19 NOTE — Telephone Encounter (Signed)
Called Pt, LM on VM advising B12 normal

## 2017-10-25 ENCOUNTER — Encounter: Payer: Self-pay | Admitting: Family Medicine

## 2017-10-26 ENCOUNTER — Telehealth: Payer: Self-pay | Admitting: Neurology

## 2017-10-26 NOTE — Telephone Encounter (Signed)
Pt called and said he got a my chart notification saying he received a message from a lab saying a test was ordered by Dr Karel JarvisAquino but he sees Dr Everlena CooperJaffe so he wants to know what is going on

## 2017-10-27 NOTE — Telephone Encounter (Signed)
Spoke with pt.  Explained that the TB test that was entered was entered in error.  Pt did not seem to understand this.  He kept wanting to know why [facility] had accessed his chart when he never had the test preformed (he did have b12 lab work done) I further explained that [facility] is our resulting lab and that since he did in fact have labwork done, [facility] needed to access his records in order to forward his results to us.  Pt again stated that he never had the test done.  He did not seem to understand that since he had the b12 lab done, that that was why [facility] accessed his chart

## 2017-11-01 ENCOUNTER — Other Ambulatory Visit: Payer: Self-pay

## 2017-11-01 ENCOUNTER — Telehealth: Payer: Self-pay | Admitting: Neurology

## 2017-11-01 DIAGNOSIS — R42 Dizziness and giddiness: Secondary | ICD-10-CM

## 2017-11-01 NOTE — Telephone Encounter (Signed)
Orders placed.

## 2017-11-01 NOTE — Telephone Encounter (Signed)
Elease Hashimotoatricia called [facility] needing to have the MRI to read MRI Brain With and Without Contrast. Please Call. Thanks

## 2017-11-01 NOTE — Progress Notes (Signed)
Mri

## 2017-11-11 ENCOUNTER — Ambulatory Visit: Payer: Self-pay | Attending: Family Medicine | Admitting: Family Medicine

## 2017-11-11 ENCOUNTER — Ambulatory Visit: Payer: Self-pay | Admitting: Licensed Clinical Social Worker

## 2017-11-11 ENCOUNTER — Encounter: Payer: Self-pay | Admitting: Family Medicine

## 2017-11-11 VITALS — BP 121/77 | HR 85 | Temp 97.9°F | Ht 72.0 in | Wt 221.2 lb

## 2017-11-11 DIAGNOSIS — N400 Enlarged prostate without lower urinary tract symptoms: Secondary | ICD-10-CM

## 2017-11-11 DIAGNOSIS — F32A Depression, unspecified: Secondary | ICD-10-CM

## 2017-11-11 DIAGNOSIS — Q782 Osteopetrosis: Secondary | ICD-10-CM

## 2017-11-11 DIAGNOSIS — F419 Anxiety disorder, unspecified: Principal | ICD-10-CM

## 2017-11-11 DIAGNOSIS — R42 Dizziness and giddiness: Secondary | ICD-10-CM

## 2017-11-11 DIAGNOSIS — L299 Pruritus, unspecified: Secondary | ICD-10-CM

## 2017-11-11 DIAGNOSIS — R0602 Shortness of breath: Secondary | ICD-10-CM

## 2017-11-11 DIAGNOSIS — Z1211 Encounter for screening for malignant neoplasm of colon: Secondary | ICD-10-CM

## 2017-11-11 DIAGNOSIS — F329 Major depressive disorder, single episode, unspecified: Secondary | ICD-10-CM

## 2017-11-11 DIAGNOSIS — Z885 Allergy status to narcotic agent status: Secondary | ICD-10-CM | POA: Insufficient documentation

## 2017-11-11 MED ORDER — FLUOXETINE HCL 20 MG PO TABS: 20.0000 mg | ORAL_TABLET | Freq: Every day | ORAL | 3 refills | Status: DC

## 2017-11-11 MED ORDER — VITAMIN D (ERGOCALCIFEROL) 1.25 MG (50000 UNIT) PO CAPS: 50000.0000 [IU] | ORAL_CAPSULE | ORAL | 0 refills | Status: DC

## 2017-11-11 MED FILL — FLUoxetine HCL 20 MG TABS: 20 | 30 days supply | Qty: 30 | Fill #0

## 2017-11-11 MED FILL — VIT D2 1.25 MG (50,000 UNIT: 1.25 MG | 63 days supply | Qty: 9 | Fill #0

## 2017-11-11 NOTE — Patient Instructions (Signed)

## 2017-11-11 NOTE — BH Specialist Note (Signed)
Integrated Behavioral Health Initial Visit  MRN: [id number] Name: Harrie Foremanric Samaras  Number of Integrated Behavioral Health Clinician visits:: 1/6 Session Start time: 11:45 AM  Session End time: 12:25 PM Total time: 40 minutes  Type of Service: Integrated Behavioral Health- Individual/Family Interpretor:No. Interpretor Name and Language: N/A   Warm Hand Off Completed.       SUBJECTIVE: Harrie Foremanric Prete is a 57 y.o. male accompanied by self Patient was referred by Dr. Alvis LemmingsNewlin for depression and anxiety. Patient reports the following symptoms/concerns: feelings of sadness and worry, difficulty sleeping, low energy, feeling bad about self, decreased concentration, and irritability Duration of problem: seven months; Severity of problem: moderate  OBJECTIVE: Mood: Anxious and Depressed and Affect: Appropriate Risk of harm to self or others: No plan to harm self or others  LIFE CONTEXT: Family and Social: Pt resides with his ex and their minor daughter. He reports limited support School/Work: Pt is unemployed Self-Care: Pt denies substance use and is not interested in medication "I don't like medications" Life Changes: Pt reports ongoing medical concerns and experiences increased anxiety due not having a solid diagnoses. He is in the process of a custody battle with partner of ten years and losing his residence.  GOALS ADDRESSED: Patient will: 1. Reduce symptoms of: anxiety, depression and stress 2. Increase knowledge and/or ability of: coping skills and stress reduction  3. Demonstrate ability to: Increase healthy adjustment to current life circumstances and Increase adequate support systems for patient/family  INTERVENTIONS: Interventions utilized: Motivational Interviewing, Supportive Counseling, Psychoeducation and/or Health Education and Link to [facility]  Standardized Assessments completed: GAD-7 and PHQ 2&9  ASSESSMENT: Patient currently experiencing anxiety and depression  triggered by ongoing medical conditions that has not been explained by a diagnosis. He is in the process of a custody battle with partner of ten years and may lose his home due to financial instability. Pt reports feelings of sadness and worry, difficulty sleeping, low energy, feeling bad about self, decreased concentration, and irritability. He receives limited support. Denies SI/HI/AVH.   Patient may benefit from psychoeducation, psychotherapy, and medication management. LCSWA educated pt on correlation between one's physical and mental health, in addition, to how stress negatively impacts both. Therapeutic interventions were discussed to assist with decreasing symptoms and managing stress. Pt states that he is unsure about initiating medication management; however, agreed with PCP to try. LCSWA provided resources for crisis intervention and psychotherapy.    PLAN: 1. Follow up with behavioral health clinician on : Pt was encouraged to contact LCSWA if symptoms worsen or fail to improve to [facility]. 2. Behavioral recommendations: LCSWA recommends that pt apply healthy coping skills discussed, comply with medication management, initiate psychotherapy, and utilize provided resources. Pt is encouraged to schedule follow up appointment with LCSWA 3. Referral(s): Integrated Art gallery managerBehavioral Health Services (In Clinic) and Community Mental Health Services (LME/Outside Clinic) 4. "From scale of 1-10, how likely are you to follow plan?":   Bridgett LarssonJasmine D Lewis, LCSW 11/12/17 4:14 PM

## 2017-11-11 NOTE — Progress Notes (Signed)
Subjective:  Patient ID: Louis Foremanric Menser, male    DOB: [birth year]  Age: 57 y.o. MRN: [id number]  CC: Results   HPI Louis Brown is a 57 year old male who presents today for a follow up of multiple complaints including dizziness, fatigue, memory loss, dyspnea, diaphoresis facial flushing. Seen by Neurology last month with no identifiable neurologic etiology but he was thought to be orthostatic; MRI brain ordered with plans for Neurocognitive testing. Labs including metanephrines, b12, thyroid have been unremarkable; cardiac , pulmonary work up so far unrevealing.  He requested CT abdomen to evaluate for phaeochromocytoma at his last visit which revealed findings below: IMPRESSION: Indirect left inguinal hernia contains long segment of the descending colon, without evidence of incarceration.  Mild enlargement of the prostate gland. Please correlate to PSA values.  5.5 mm right middle lobe solid pulmonary nodule, stable from January 2019. Non-contrast chest CT at 6-12 months is recommended. If the nodule is stable at time of repeat CT, then future CT at 18-24 months (from today's scan) is considered optional for low-risk patients, but is recommended for high-risk patients. This recommendation follows the consensus statement: Guidelines for Management of Incidental Pulmonary Nodules Detected on CT Images: From the Fleischner Society 2017; Radiology 2017; 284:228-243.  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  Numerous few mm sclerotic lesions throughout the pelvic bones. In the absence of malignancy capable of producing sclerotic lesions these may represent bone islands. Please correlate to patient's history.   Today he complains of intermittent pruritus but no rash, intermittent twitching of his eye lid, persisting dizziness and has fallen twice; he declined taking Meclizine which I had previously prescribed; "I do not like to take medicine". Curious to know why  his absolute eosinophile count is elevated and why one of the diameters of the left ventricle from his echo is a bit off. Denies having anxiety or depression but at the end of the encounter admits to being stressed from being in a custody battle with his wife over their daughter and he states "she is a Child psychotherapistsocial worker with [employer] [employer] and she has all the connections to fight me. She can look into my records to fight me."he is also on the verge of loosing his home. He denies suicidal ideation or intents.   Past Medical History:  Diagnosis Date  . Inguinal hernia     Past Surgical History:  Procedure Laterality Date  . IR RADIOLOGIST EVAL & MGMT  09/16/2017  . KNEE ARTHROSCOPY    . KNEE SURGERY      Allergies  Allergen Reactions  . Codeine Itching     Outpatient Medications Prior to Visit  Medication Sig Dispense Refill  . atorvastatin (LIPITOR) 20 MG tablet Take 1 tablet (20 mg total) by mouth daily. (Patient not taking: Reported on 10/18/2017) 30 tablet 3  . meclizine (ANTIVERT) 25 MG tablet Take 1 tablet (25 mg total) by mouth 3 (three) times daily as needed for dizziness. (Patient not taking: Reported on 10/18/2017) 30 tablet 0  . Vitamin D, Ergocalciferol, (DRISDOL) 50000 units CAPS capsule Take 1 capsule (50,000 Units total) by mouth every 7 (seven) days. (Patient not taking: Reported on 10/05/2017) 16 capsule 0   No facility-administered medications prior to visit.     ROS Review of Systems  Constitutional: Negative for activity change and appetite change.  HENT: Negative for sinus pressure and sore throat.   Eyes: Negative for visual disturbance.  Respiratory: Positive for shortness of breath. Negative for cough and chest  tightness.   Cardiovascular: Negative for chest pain and leg swelling.  Gastrointestinal: Negative for abdominal distention, abdominal pain, constipation and diarrhea.  Endocrine: Negative.   Genitourinary: Negative for dysuria.  Musculoskeletal: Negative for  joint swelling and myalgias.  Skin: Negative for rash.  Allergic/Immunologic: Negative.   Neurological: Positive for light-headedness. Negative for weakness and numbness.  Psychiatric/Behavioral: Negative for dysphoric mood and suicidal ideas.    Objective:  BP 121/77   Pulse 85   Temp 97.9 F (36.6 C) (Oral)   Ht 6' (1.829 m)   Wt 221 lb 3.2 oz (100.3 kg)   SpO2 97%   BMI 30.00 kg/m   BP/Weight 11/11/2017 10/18/2017 10/05/2017  Systolic BP 121 122 109  Diastolic BP 77 90 74  Wt. (Lbs) 221.2 225.4 223.2  BMI 30 30.57 30.27      Physical Exam  Constitutional: He is oriented to person, place, and time. He appears well-developed and well-nourished.  Cardiovascular: Normal rate, normal heart sounds and intact distal pulses.  No murmur heard. Pulmonary/Chest: Effort normal and breath sounds normal. He has no wheezes. He has no rales. He exhibits no tenderness.  Abdominal: Soft. Bowel sounds are normal. He exhibits no distension and no mass. There is no tenderness.  Musculoskeletal: Normal range of motion.  Neurological: He is alert and oriented to person, place, and time.  Skin: Skin is warm and dry.  Psychiatric:  Anxious     Assessment & Plan:   1. Dizziness Work up so far unrevealing MRI brain and neurocognitive testing as per Neuro Declines Meclizine for presumptive treatment of vertigo  2. Anxiety and depression Multiple underlying stressors including custody battle, impending homelessness, financial situation He is reluctant to commencing medications but has agreed to try it and I have discussed onset of action, mode of action, toerability and side effect profile with him Unsure how much of his symptoms are somatic LCSW called in for counselling - FLUoxetine (PROZAC) 20 MG tablet; Take 1 tablet (20 mg total) by mouth daily.  Dispense: 30 tablet; Refill: 3  3. Pruritus Offered prescription for Hydroxyzine which he declines ? Somatic symptoms  4. Bony  sclerosis Evident on CT abdomen and pelvis He has no bone pains Will send off labs and consider hematology referral down the road - Serum protein electrophoresis with reflex - PTH, Intact and Calcium  5. Screening for colon cancer - Ambulatory referral to Gastroenterology  6. Enlarged prostate Seen an abdominal imaging - PSA  7. Shortness of breath Oxygen saturation is normal, lung imaging reveals stable R  lung nodule Declines Proventil inhaler   Meds ordered this encounter  Medications  . Vitamin D, Ergocalciferol, (DRISDOL) 50000 units CAPS capsule    Sig: Take 1 capsule (50,000 Units total) by mouth every 7 (seven) days.    Dispense:  9 capsule    Refill:  0  . FLUoxetine (PROZAC) 20 MG tablet    Sig: Take 1 tablet (20 mg total) by mouth daily.    Dispense:  30 tablet    Refill:  3    Follow-up: 1 month   Hoy Register MD

## 2017-11-13 ENCOUNTER — Ambulatory Visit
Admission: RE | Admit: 2017-11-13 | Discharge: 2017-11-13 | Disposition: A | Discharge status: Home / Self Care | Payer: No Typology Code available for payment source | Source: Ambulatory Visit | Attending: Neurology | Admitting: Neurology

## 2017-11-13 DIAGNOSIS — R42 Dizziness and giddiness: Secondary | ICD-10-CM

## 2017-11-13 MED ORDER — GADOBENATE DIMEGLUMINE 529 MG/ML IV SOLN
20.0000 mL | Freq: Once | INTRAVENOUS | Status: AC | PRN
  Administered 2017-11-13: 20 mL via INTRAVENOUS

## 2017-11-13 MED ORDER — GADOBENATE DIMEGLUMINE 529 MG/ML IV SOLN: 20.0000 mL | Freq: Once | INTRAVENOUS | Status: DC | PRN

## 2017-11-15 ENCOUNTER — Encounter: Payer: Self-pay | Admitting: Family Medicine

## 2017-11-15 LAB — PROTEIN ELECTROPHORESIS, SERUM, WITH REFLEX
A/G Ratio: 1.1 (ref 0.7–1.7)
ALBUMIN ELP: 4 g/dL (ref 2.9–4.4)
ALPHA 2: 0.9 g/dL (ref 0.4–1.0)
Alpha 1: 0.2 g/dL (ref 0.0–0.4)
BETA: 1.5 g/dL — AB (ref 0.7–1.3)
GAMMA GLOBULIN: 0.9 g/dL (ref 0.4–1.8)
Globulin, Total: 3.5 g/dL (ref 2.2–3.9)
Total Protein: 7.5 g/dL (ref 6.0–8.5)

## 2017-11-15 LAB — PTH, INTACT AND CALCIUM
Calcium: 9.8 mg/dL (ref 8.7–10.2)
PTH: 36 pg/mL (ref 15–65)

## 2017-11-15 LAB — PSA: Prostate Specific Ag, Serum: 0.9 ng/mL (ref 0.0–4.0)

## 2017-11-15 NOTE — Telephone Encounter (Signed)
Please clarify for patient 

## 2017-11-16 ENCOUNTER — Encounter: Payer: Self-pay | Admitting: Psychology

## 2017-11-16 NOTE — Progress Notes (Signed)
Patient contacted me through finding information about the PSP/atypical parkinsonian support group.  Patient inquired if I had information on PSP as he was concerned that he may have this diagnosis.  I did not give any medical advice.  I identified that he has seen Dr. Tomi Brown in the past and is still his current neurologist.  I also identified with him that he saw a social worker in the past week.  It is apparent that the patient is struggling with not having a diagnosis that are attached to the  Symptoms that he is experiencing.  I recommended that he contact the social worker that he met with to share some of his concerns of not being able to work and the fear of losing access to his son.  There may be resources that the social worker can help with.  I also reinforced practicing good coping mechanisms to help manage any stress and anxiety that he may be feeling at this time. I did share with him that I would let Dr. Tomi Brown and Louis See, LCSW  both know of my interaction with him today.  The patient was very tearful during the conversation.  I provided an empathetic approach, although, I am not sure I was able to provide what he  was looking for during this call.

## 2017-11-19 ENCOUNTER — Other Ambulatory Visit: Payer: Self-pay | Admitting: Family Medicine

## 2017-11-19 DIAGNOSIS — Q782 Osteopetrosis: Secondary | ICD-10-CM

## 2017-11-19 NOTE — Progress Notes (Unsigned)
Pls inform him I referred him to Ortho to evaluate CT findings of sclerotic lesions on pelvis.

## 2017-11-23 ENCOUNTER — Other Ambulatory Visit: Payer: Self-pay | Admitting: Family Medicine

## 2017-11-23 ENCOUNTER — Encounter: Payer: Self-pay | Admitting: Family Medicine

## 2017-11-23 ENCOUNTER — Ambulatory Visit: Payer: Self-pay | Attending: Family Medicine | Admitting: Family Medicine

## 2017-11-23 VITALS — BP 120/82 | HR 69 | Temp 97.9°F | Ht 72.0 in | Wt 221.0 lb

## 2017-11-23 DIAGNOSIS — F329 Major depressive disorder, single episode, unspecified: Secondary | ICD-10-CM | POA: Insufficient documentation

## 2017-11-23 DIAGNOSIS — Z885 Allergy status to narcotic agent status: Secondary | ICD-10-CM | POA: Insufficient documentation

## 2017-11-23 DIAGNOSIS — F419 Anxiety disorder, unspecified: Secondary | ICD-10-CM

## 2017-11-23 DIAGNOSIS — R42 Dizziness and giddiness: Secondary | ICD-10-CM

## 2017-11-23 DIAGNOSIS — Z79899 Other long term (current) drug therapy: Secondary | ICD-10-CM | POA: Insufficient documentation

## 2017-11-23 DIAGNOSIS — Z9889 Other specified postprocedural states: Secondary | ICD-10-CM | POA: Insufficient documentation

## 2017-11-23 MED ORDER — CETIRIZINE HCL 10 MG PO TABS: 10.0000 mg | ORAL_TABLET | Freq: Every day | ORAL | 3 refills | Status: AC

## 2017-11-23 MED FILL — ?CETIRIZINE HCL 10 MG TABLE: 10 | 30 days supply | Qty: 30 | Fill #0

## 2017-11-23 NOTE — Progress Notes (Signed)
Subjective:  Patient ID: Louis Brown, male    DOB: [birth year]  Age: 57 y.o. MRN: [id number]  CC: Dizziness  HPI Louis Brown  is a 57 year old male who presents today for a follow up of multiple complaints including dizziness, fatigue, memory loss, dyspnea, diaphoresis facial flushing. Seen by Neurology last month with no identifiable neurologic etiology but he was thought to be orthostatic; MRI brain ordered with plans for Neurocognitive testing. Labs including metanephrines, b12, thyroid have been unremarkable; cardiac , vascular, pulmonary work up so far unrevealing.   I had started him on Prozac at his last office visit 2 weeks ago and he informed me he has not been compliant with it for the entire 2 weeks but did notice 2 days ago he had increased energy and was able to mow his whole lawn and did not experience the dizziness he had been noticing but then yesterday did had an episode where he felt his blood pressure dropped and he was weak and dizzy however he did not check his blood pressure as this occurred while he was at the store. He is concerned about his dizziness and is wondering if he has a bony growth in his ears of fluid in his ears and might need a CT scan to check for that. MRI of the brain from 11/14/17 came back with no acute or focal intracranial abnormality and he has a follow-up with neurology in 03/2018. I had placed him on meclizine previously which he states did not work. He does have an appointment for an upcoming colonoscopy and I had referred him to orthopedics due to finding of bony sclerosis in his pelvic and femoral bones.  He does seem to be anxious today but this is improved compared to his last visit and he is declining therapy sessions with the LCSW.  Denies suicidal ideation or intents.  Past Medical History:  Diagnosis Date  . Inguinal hernia     Past Surgical History:  Procedure Laterality Date  . IR RADIOLOGIST EVAL & MGMT  09/16/2017  . KNEE  ARTHROSCOPY    . KNEE SURGERY      Allergies  Allergen Reactions  . Codeine Itching      Outpatient Medications Prior to Visit  Medication Sig Dispense Refill  . FLUoxetine (PROZAC) 20 MG tablet Take 1 tablet (20 mg total) by mouth daily. 30 tablet 3  . Vitamin D, Ergocalciferol, (DRISDOL) 50000 units CAPS capsule Take 1 capsule (50,000 Units total) by mouth every 7 (seven) days. 9 capsule 0  . atorvastatin (LIPITOR) 20 MG tablet Take 1 tablet (20 mg total) by mouth daily. (Patient not taking: Reported on 10/18/2017) 30 tablet 3  . meclizine (ANTIVERT) 25 MG tablet Take 1 tablet (25 mg total) by mouth 3 (three) times daily as needed for dizziness. (Patient not taking: Reported on 10/18/2017) 30 tablet 0   No facility-administered medications prior to visit.     ROS Review of Systems  Constitutional: Negative for activity change and appetite change.  HENT: Negative for sinus pressure and sore throat.   Eyes: Negative for visual disturbance.  Respiratory: Negative for cough, chest tightness and shortness of breath.   Cardiovascular: Negative for chest pain and leg swelling.  Gastrointestinal: Negative for abdominal distention, abdominal pain, constipation and diarrhea.  Endocrine: Negative.   Genitourinary: Negative for dysuria.  Musculoskeletal: Negative for joint swelling and myalgias.  Skin: Negative for rash.  Allergic/Immunologic: Negative.   Neurological: Positive for light-headedness. Negative for weakness and numbness.  Psychiatric/Behavioral: Negative for dysphoric mood and suicidal ideas.    Objective:  BP 120/82   Pulse 69   Temp 97.9 F (36.6 C) (Oral)   Ht 6' (1.829 m)   Wt 221 lb (100.2 kg)   SpO2 97%   BMI 29.97 kg/m   BP/Weight 11/23/2017 11/11/2017 10/18/2017  Systolic BP 120 121 122  Diastolic BP 82 77 90  Wt. (Lbs) 221 221.2 225.4  BMI 29.97 30 30.57      Physical Exam  Constitutional: He is oriented to person, place, and time. He appears  well-developed and well-nourished.  Cardiovascular: Normal rate, normal heart sounds and intact distal pulses.  No murmur heard. Pulmonary/Chest: Effort normal and breath sounds normal. He has no wheezes. He has no rales. He exhibits no tenderness.  Abdominal: Soft. Bowel sounds are normal. He exhibits no distension and no mass. There is no tenderness.  Musculoskeletal: Normal range of motion.  Neurological: He is alert and oriented to person, place, and time.  Psychiatric:  Anxious     Assessment & Plan:   1. Dizziness MRI brain unrevealing Symptoms are uncontrolled on meclizine  Will place on antihistamine given associated symptoms of ear fullness however he is wondering if he needs to have a CT scan to check for  going to growth in his ears a referral to ENT but I have discussed with him my concerns about his recurring exposure to radiation in the presence of the multiple workup which he has undergone and have recommended trial of Zyrtec - cetirizine (ZYRTEC) 10 MG tablet; Take 1 tablet (10 mg total) by mouth daily.  Dispense: 30 tablet; Refill: 3  2. Anxiety and depression He has had good days and bad days but overall seems to improve slightly on Prozac Declines speaking with the LCSW today. Strongly encouraged to comply with Prozac and we have discussed the onset of action of at least 3-4 weeks   Meds ordered this encounter  Medications  . cetirizine (ZYRTEC) 10 MG tablet    Sig: Take 1 tablet (10 mg total) by mouth daily.    Dispense:  30 tablet    Refill:  3    Follow-up: Return in about 3 months (around 02/23/2018) for follow up of chronic medical conditions; cancel pre- existing appointments.   Hoy RegisterEnobong Adriel Desrosier MD

## 2017-11-23 NOTE — Patient Instructions (Signed)

## 2017-11-23 NOTE — Progress Notes (Signed)
I dont see an order for a CT scan in his chart ordered by you.

## 2017-11-29 ENCOUNTER — Encounter (INDEPENDENT_AMBULATORY_CARE_PROVIDER_SITE_OTHER): Payer: Self-pay | Admitting: Orthopaedic Surgery

## 2017-11-29 ENCOUNTER — Ambulatory Visit (INDEPENDENT_AMBULATORY_CARE_PROVIDER_SITE_OTHER): Payer: Self-pay | Admitting: Orthopaedic Surgery

## 2017-11-29 DIAGNOSIS — M1611 Unilateral primary osteoarthritis, right hip: Secondary | ICD-10-CM

## 2017-11-29 DIAGNOSIS — Q782 Osteopetrosis: Secondary | ICD-10-CM

## 2017-11-29 DIAGNOSIS — M1612 Unilateral primary osteoarthritis, left hip: Secondary | ICD-10-CM

## 2017-11-29 NOTE — Progress Notes (Addendum)
Office Visit Note   Patient: Louis Brown           Date of Birth: November 19, 1960           MRN: [id number] Visit Date: 11/29/2017              Requested by: Hoy Register, MD [address] [place], [place] [address] PCP: Hoy Register, MD   Assessment & Plan: Visit Diagnoses:  1. Primary osteoarthritis of right hip   2. Primary osteoarthritis of left hip   3. Bony sclerosis     Plan: Overall impression is mild bilateral hip osteoarthritis and incidental finding of multiple sclerotic lesions that do not appear aggressive or suspicious for malignancy.  Patient does not seem to be bothered too much by the hip arthritis.  He will continue with symptomatic treatment with over-the-counter NSAIDs.  In terms of his lesions I think these are consistent with benign bony islands especially since he has had no personal or family history of cancer.  Questions encouraged and answered.  Reassurance was provided today.  I will see him back as needed.  Follow-Up Instructions: Return if symptoms worsen or fail to improve.   Orders:  No orders of the defined types were placed in this encounter.  No orders of the defined types were placed in this encounter.     Procedures: No procedures performed   Clinical Data: No additional findings.   Subjective: Chief Complaint  Patient presents with  . Pelvis - Pain    Patient is a 57 year old gentleman who comes in for referral of bilateral hip pain and incidental finding of multiple sclerotic lesions on pelvis CT.  He states that he has mainly fatigue and problems with vertigo.  He does complain of bilateral hip pain is worse with activity.  Denies any radicular symptoms.   Review of Systems  Constitutional: Negative.   All other systems reviewed and are negative.    Objective: Vital Signs: There were no vitals taken for this visit.  Physical Exam  Constitutional: He is oriented to person, place, and time. He appears well-developed  and well-nourished.  HENT:  Head: Normocephalic and atraumatic.  Eyes: Pupils are equal, round, and reactive to light.  Neck: Neck supple.  Pulmonary/Chest: Effort normal.  Abdominal: Soft.  Musculoskeletal: Normal range of motion.  Neurological: He is alert and oriented to person, place, and time.  Skin: Skin is warm.  Psychiatric: He has a normal mood and affect. His behavior is normal. Judgment and thought content normal.  Nursing note and vitals reviewed.   Ortho Exam Bilateral hip exam is relatively benign.  He has painless rotation of the hips.  Trochanteric bursa is nontender.  Pelvis is nontender. Specialty Comments:  No specialty comments available.  Imaging: No results found.   PMFS History: Patient Active Problem List   Diagnosis Date Noted  . Anxiety and depression 11/11/2017  . 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 Mixed dyslipidemia 09/16/2017  . DOE (dyspnea on exertion) 07/29/2017  . Dizziness 05/07/2017  . Bradycardia 05/07/2017  . Inguinal hernia 03/27/2015  . Sebaceous cyst 03/27/2015   Past Medical History:  Diagnosis Date  . Inguinal hernia     Family History  Problem Relation Age of Onset  . Kidney disease Father   . Arrhythmia Mother   . Irregular heart beat Brother     Past Surgical History:  Procedure Laterality Date  . IR RADIOLOGIST EVAL & MGMT  09/16/2017  . KNEE ARTHROSCOPY    .  KNEE SURGERY     Social History   Occupational History  . Not on file  Tobacco Use  . Smoking status: Never Smoker  . Smokeless tobacco: Never Used  Substance and Sexual Activity  . Alcohol use: Yes    Comment: social  . Drug use: No    Types: Marijuana    Comment: occ  . Sexual activity: Not on file

## 2017-12-13 ENCOUNTER — Other Ambulatory Visit: Payer: Self-pay | Admitting: Family Medicine

## 2017-12-13 MED FILL — ?ATORVASTATIN 20 MG TABLET: 20 | 30 days supply | Qty: 30 | Fill #1

## 2017-12-13 MED FILL — FLUoxetine HCL 20 MG TABS: 20 | 30 days supply | Qty: 30 | Fill #1

## 2017-12-21 ENCOUNTER — Ambulatory Visit (INDEPENDENT_AMBULATORY_CARE_PROVIDER_SITE_OTHER): Payer: Self-pay | Admitting: Psychology

## 2017-12-21 ENCOUNTER — Encounter: Payer: Self-pay | Admitting: Psychology

## 2017-12-21 DIAGNOSIS — R413 Other amnesia: Secondary | ICD-10-CM

## 2017-12-21 DIAGNOSIS — F419 Anxiety disorder, unspecified: Secondary | ICD-10-CM
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
## 2017-12-27 ENCOUNTER — Encounter: Payer: Self-pay | Admitting: Neurology

## 2017-12-28 ENCOUNTER — Ambulatory Visit (INDEPENDENT_AMBULATORY_CARE_PROVIDER_SITE_OTHER): Payer: Self-pay | Admitting: Psychology

## 2017-12-28 ENCOUNTER — Ambulatory Visit: Payer: Self-pay | Admitting: Family Medicine

## 2017-12-28 ENCOUNTER — Encounter: Payer: Self-pay | Admitting: Psychology

## 2017-12-28 DIAGNOSIS — R413 Other amnesia: Secondary | ICD-10-CM

## 2017-12-28 NOTE — Progress Notes (Signed)
   Neuropsychology Note  Louis Brown completed 120 minutes of neuropsychological testing with technician, Louis Brown, BS, under the supervision of Dr. Elvis Coil, Licensed Psychologist. The patient did not appear overtly distressed by the testing session, per behavioral observation or via self-report to the technician. Rest breaks were offered.   Clinical Decision Making: In considering the patient's current level of functioning, level of presumed impairment, nature of symptoms, emotional and behavioral responses during the interview, level of literacy, and observed level of motivation/effort, a battery of tests was selected and communicated to the psychometrician.  Communication between the psychologist and technician was ongoing throughout the testing session and changes were made as deemed necessary based on patient performance on testing, technician observations and additional pertinent factors such as those listed above.  Louis Brown will return within approximately 2 weeks for an interactive feedback session with Dr. Alinda Dooms at which time his test performances, clinical impressions and treatment recommendations will be reviewed in detail. The patient understands he can contact our office should he require our assistance before this time.  35 minutes spent performing neuropsychological evaluation services/clinical decision making (psychologist). [CPT 96132] 120 minutes spent face-to-face with patient administering standardized tests, 60 minutes spent scoring (technician). [CPT [id number], 96139]  Full report to follow.
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
## 2018-01-04 ENCOUNTER — Encounter: Payer: Self-pay | Admitting: Psychology

## 2018-01-04 ENCOUNTER — Ambulatory Visit (INDEPENDENT_AMBULATORY_CARE_PROVIDER_SITE_OTHER): Payer: Self-pay | Admitting: Psychology

## 2018-01-04 DIAGNOSIS — R413 Other amnesia: Secondary | ICD-10-CM

## 2018-01-04 DIAGNOSIS — F4323 Adjustment disorder with mixed anxiety and depressed mood: Secondary | ICD-10-CM

## 2018-01-04 NOTE — Patient Instructions (Signed)
Fortunately, results of cognitive testing were entirely within normal limits and do NOT indicate dementia or other neurocognitive disorder.  It is most likely the case that your cognitive symptoms in daily life are due to depression and anxiety (related to current stressors and unexplained physiological symptoms).   You will follow up with Dr. Everlena Cooper about your neurologic/physiological symptoms.   I think you would benefit from counseling to assist with stress management and coping during this difficult time.   The effect of depression and anxiety on your cognitive functioning: . One of the typical symptoms of depression is difficulty concentrating and making decisions, and various types of anxiety also interfere with attention and concentration . Problems with attention and concentration can disrupt the process of learning and making new memories, which can make it seem like there is a problem with your memory. In your daily life, you may experience this disruption as forgetting names and appointments, misplacing items, and needing to make lists for shopping and errands. It may be harder for you to stay focused on tasks and feel as "sharp" as you did in the past.  . Also, when we are depressed or anxious, we often pay more attention to our difficulties (rather than our strengths) in our daily life, and this can make it seem to [place] like we are doing worse cognitively than we really are. . The cognitive aspects of depression and anxiety are sometimes observed as an identifiable pattern of poor performance on a neuropsychological evaluation, but it is also possible that all scores on an evaluation are within normal limits. . Regardless of the test scores, distress related to depression and anxiety can interfere with the ability to make use of your cognitive resources and function optimally across settings such as work or school, maintaining the home and responsibilities, and personal  relationships. . Fortunately, there are treatments for depression and anxiety, and when mood improves, cognitive functioning in daily life often improves. . Treatment options include psychotherapy, medications (e.g., antidepressants), and behavioral changes, such as increasing your involvement in enjoyable activities, increasing the amount of exercise you are getting, and maintaining a regular routine.

## 2018-01-07 ENCOUNTER — Ambulatory Visit: Payer: Self-pay

## 2018-01-13 MED FILL — ?CETIRIZINE HCL 10 MG TABLE: 10 | 30 days supply | Qty: 30 | Fill #1

## 2018-01-13 MED FILL — ?FLUOXETINE HCL 20MG TAB: 20 | 30 days supply | Qty: 30 | Fill #2

## 2018-01-13 MED FILL — ?ATORVASTATIN 20 MG TABLET: 20 | 30 days supply | Qty: 30 | Fill #2

## 2018-01-14 ENCOUNTER — Other Ambulatory Visit: Payer: Self-pay

## 2018-01-14 ENCOUNTER — Encounter: Payer: Self-pay | Admitting: Gastroenterology

## 2018-01-14 ENCOUNTER — Telehealth: Payer: Self-pay

## 2018-01-14 ENCOUNTER — Ambulatory Visit (INDEPENDENT_AMBULATORY_CARE_PROVIDER_SITE_OTHER): Payer: Self-pay | Admitting: Gastroenterology

## 2018-01-14 DIAGNOSIS — Z86010 Personal history of colonic polyps: Secondary | ICD-10-CM

## 2018-01-14 DIAGNOSIS — K625 Hemorrhage of anus and rectum: Secondary | ICD-10-CM

## 2018-01-14 NOTE — Telephone Encounter (Signed)
Called and informed pt of pre-op appt 02/28/18 at 12:45pm. Letter mailed.

## 2018-01-14 NOTE — Patient Instructions (Signed)
1. Colonoscopy as scheduled. See separate instructions.  

## 2018-01-14 NOTE — Assessment & Plan Note (Signed)
57 year old gentleman presenting with 1 year history of intermittent bright red blood per rectum at times associated with blood clots, wakes up daily with abdominal bloating and discomfort, prior history of colon polyps.  He has a large indirect left-sided inguinal hernia, on recent imaging contained descending colon.  This may be contributing to his bowel symptoms.  Would recommend colonoscopy for further evaluation.  Plan for deep sedation given use of antidepressants to ensure adequate sedation.  I have discussed the risks, alternatives, benefits with regards to but not limited to the risk of reaction to medication, bleeding, infection, perforation and the patient is agreeable to proceed. Written consent to be obtained.  Discussed with patient, unlikely can explain his symptoms of imbalance, tinnitus, brain fog based on GI findings.  He will continue to follow-up with neurology, PCP.  Given his significant tinnitus and imbalance issues, would consider ENT evaluation for Mnire's disease. But again, not likely etiology of all of his neurological concerns.

## 2018-01-14 NOTE — Progress Notes (Signed)
Primary Care Physician:  Hoy Register, MD  Primary Gastroenterologist:  Roetta Sessions, MD   Chief Complaint  Patient presents with  . Consult    TCS  . Rectal Bleeding    ligth red-dark color looks like clot usually happens in the morning but not daily    HPI:  Louis Brown is a 57 y.o. male here the request of Dr. Alvis Lemmings for screening colonoscopy.  Patient states he had a colonoscopy about 12 years ago and had 5 polyps removed.  He complains of a intermittent blood per rectum, sometimes dime sized blood clots. Some days no blood.  Symptoms occurring off and on for about a year.  Every morning when he wakes up he has a lot of noise and grumbling in the abdomen, hears fluid moving through his colon.  Usually wakes up at 4 AM every morning with the symptoms.  When he goes to have a bowel movement he passes a small amount of yellow mucus with blood clots.  He generally go back to bed for a few hours.  When he gets up and has a couple coffee he can have a complete bowel movement.  If he does not drink his coffee he will have 3 or 4 small stools until he finally finishes.  He has a known left-sided inguinal hernia fairly large in size.  He is able to reduce it.  Has a lot of issues doctors are trying to sort out and nobody knows what's wrong.  Patient states symptoms killing me but no one knows what it is yet.  Symptoms started last spring.  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  He noted increasing fatigue, can only work a few hours at a time..  Also developed profuse sweating with only a few minutes of work.  By the end of her workday so tired and every joint in his body with a kidney can hardly get out of the car.  Felt like he can never get enough water.  Notes he only drinks water and coffee.  Complained of severe cramps in hands and calves.  On April 24, 2017 symptoms were severe and he had to go to the emergency department.  Scribes having 48 test including his blood work but no one has any  answers to the etiology of his symptoms.  Patient states he has been evaluated by cardiology.  He reports blood pressure with systolic 130s at the beginning of treadmill stress test, within 6 minutes of exercising his systolic pressure was 210 and the study had to be stopped.  States he does have some pelvic lesions in the right femur lesion which is felt to be related to arthritis.  He has been evaluated by neurology.    He also complains of diffuse itching, ringing in the ears, equilibrium issues.    Current Outpatient Medications  Medication Sig Dispense Refill  . atorvastatin (LIPITOR) 20 MG tablet Take 1 tablet (20 mg total) by mouth daily. 30 tablet 3  . cetirizine (ZYRTEC) 10 MG tablet Take 1 tablet (10 mg total) by mouth daily. 30 tablet 3  . FLUoxetine (PROZAC) 20 MG tablet Take 1 tablet (20 mg total) by mouth daily. 30 tablet 3   No current facility-administered medications for this visit.     Allergies as of 01/14/2018 - Review Complete 01/14/2018  Allergen Reaction Noted  . Codeine Itching 10/25/2013    Past Medical History:  Diagnosis Date  . Inguinal hernia     Past Surgical History:  Procedure Laterality Date  . IR RADIOLOGIST EVAL & MGMT  09/16/2017  . KNEE ARTHROSCOPY    . KNEE SURGERY      Family History  Problem Relation Age of Onset  . Kidney disease Father   . Arrhythmia Mother   . Irregular heart beat Brother   . Colon cancer Neg Hx   . Liver cancer Neg Hx   . Celiac disease Neg Hx   . Inflammatory bowel disease Neg Hx     Social History   Socioeconomic History  . Marital status: Single    Spouse name: Not on file  . Number of children: Not on file  . Years of education: Not on file  . Highest education level: Not on file  Occupational History  . Not on file  Social Needs  . Financial resource strain: Not on file  . Food insecurity:    Worry: Not on file    Inability: Not on file  . Transportation needs:    Medical: Not on file     Non-medical: Not on file  Tobacco Use  . Smoking status: Former Smoker    Types: Cigarettes    Last attempt to quit: 08/31/2009    Years since quitting: 8.3  . Smokeless tobacco: Never Used  Substance and Sexual Activity  . Alcohol use: Yes    Comment: socially. Not daily or weekly.  . Drug use: Yes    Types: Marijuana    Comment: occ  . Sexual activity: Not on file  Lifestyle  . Physical activity:    Days per week: Not on file    Minutes per session: Not on file  . Stress: Not on file  Relationships  . Social connections:    Talks on phone: Not on file    Gets together: Not on file    Attends religious service: Not on file    Active member of club or organization: Not on file    Attends meetings of clubs or organizations: Not on file    Relationship status: Not on file  . Intimate partner violence:    Fear of current or ex partner: Not on file    Emotionally abused: Not on file    Physically abused: Not on file    Forced sexual activity: Not on file  Other Topics Concern  . Not on file  Social History Narrative  . Not on file      ROS:  General: Negative for anorexia, weight loss, fever, chills, +fatigue,+ weakness. Eyes: Negative for vision changes.  ENT: Negative for hoarseness, difficulty swallowing , nasal congestion. Ringing in ears CV: Negative for chest pain, angina, palpitations, dyspnea on exertion, peripheral edema.  Respiratory: Negative for dyspnea at rest, dyspnea on exertion, cough, sputum, wheezing.  GI: See history of present illness. GU:  Negative for dysuria, hematuria, urinary incontinence, urinary frequency, nocturnal urination. +inguinal hernia on left MS: Positive for joint pain.  Derm: Negative for rash or itching.  Neuro: Negative for weakness, abnormal sensation, seizure, frequent headaches, +memory loss, confusion. +imbalance Psych: + anxiety. No depression, suicidal ideation, hallucinations.  Endo: Negative for unusual weight change.   Heme: Negative for bruising or bleeding. Allergy: Negative for rash or hives.+itching    Physical Examination:  BP 135/89   Pulse 70   Temp (!) 97.1 F (36.2 C) (Oral)   Ht 6' (1.829 m)   Wt 223 lb 3.2 oz (101.2 kg)   BMI 30.27 kg/m    General: very pleasant, Well-nourished,  well-developed in no acute distress.  Head: Normocephalic, atraumatic.   Eyes: Conjunctiva pink, no icterus. Mouth: Oropharyngeal mucosa moist and pink  Neck: Supple   Lungs: Clear to auscultation bilaterally.  Heart: Regular rate and rhythm, no murmurs rubs or gallops.  Abdomen: Bowel sounds are normal, nontender, nondistended, no hepatosplenomegaly or masses, no abdominal bruits, no rebound or guarding. Large inguinal hernia on the left, reducible by patient on exam.    Rectal: deferred Extremities: No lower extremity edema. No clubbing or deformities.  Neuro: Alert and oriented x 4 , grossly normal neurologically.  Skin: Warm and dry, no rash or jaundice.   Psych: Alert and cooperative, normal mood and affect.  Labs: Lab Results  Component Value Date   WBC 7.1 10/05/2017   HGB 15.4 10/05/2017   HCT 45.1 10/05/2017   MCV 94 10/05/2017   PLT 315 10/05/2017   Lab Results  Component Value Date   CREATININE 1.00 04/24/2017   BUN 16 04/24/2017   NA 139 04/24/2017   K 4.4 04/24/2017   CL 104 04/24/2017   CO2 26 04/24/2017   Lab Results  Component Value Date   ALT 27 04/24/2017   AST 28 04/24/2017   ALKPHOS 54 04/24/2017   BILITOT 1.1 04/24/2017   Lab Results  Component Value Date   TSH 1.120 10/05/2017   Lab Results  Component Value Date   VITAMINB12 447 10/18/2017   Lab Results  Component Value Date   TSH 1.120 10/05/2017   Am cortisol 6 on 05/2017 at 10:29 am.   Imaging Studies:   CT chest, abdomen pelvis with contrast dated 10/14/2017: Stable right middle lobe solid pulmonary nodule measuring 5.5 mm, indirect left inguinal hernia containing portions of descending colon  without incarceration, hernia neck measuring 3.5 cm.  Numerous few millimeters sclerotic lesions throughout the pelvic bones and right proximal femur, in the absence of malignancy likely represent bone islands.

## 2018-01-17 NOTE — Progress Notes (Signed)
cc'ed to pcp °

## 2018-01-20 ENCOUNTER — Ambulatory Visit (INDEPENDENT_AMBULATORY_CARE_PROVIDER_SITE_OTHER): Payer: Self-pay | Admitting: Neurology

## 2018-01-20 ENCOUNTER — Encounter: Payer: Self-pay | Admitting: Neurology

## 2018-01-20 VITALS — BP 128/88 | HR 92 | Ht 72.0 in | Wt 225.0 lb

## 2018-01-20 DIAGNOSIS — F4323 Adjustment disorder with mixed anxiety and depressed mood: Secondary | ICD-10-CM

## 2018-01-20 DIAGNOSIS — H8111 Benign paroxysmal vertigo, right ear: Secondary | ICD-10-CM

## 2018-01-20 DIAGNOSIS — R42 Dizziness and giddiness: Secondary | ICD-10-CM

## 2018-01-20 NOTE — Progress Notes (Signed)
NEUROLOGY FOLLOW UP OFFICE NOTE  Louis Brown [id number]  HISTORY OF PRESENT ILLNESS: Louis Brown is a 57 year old right-handed male with no significant past medical history who follows up for memory deficits.  UPDATE: MRI of brain with and without contrast from 11/13/17 was personally reviewed and was normal.  He underwent neuropsychological testing on 12/28/17, which demonstrated no cognitive impairment with memory deficits likely secondary to adjustment disorder with depressed and anxious mood.   He continues to have constant dizziness with brief positional vertigo, such as when he turns over to his right side in bed.  He reports cramping in his hands.  He reports periodic brief shocks in his toes.   HISTORY: Since early 2018, he reports not feeling well.  He reports feeling fatigued and experiencing joint pains.  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  On 04/24/17, he presented to the ED with acute onset of dizziness which started on the previous day.  He denied spinning sensation.  He noted sensation of feeling off balance with undulating sensation and sometimes a lightheadedness.  There was no associated headache, visual disturbance or unilateral numbness and weakness.  CT and MRI of brain without contrast were personally reviewed and were normal.  He was evaluated by cardiology.  Holter monitor, stress test and carotid ultrasound were unremarkable.     He continues to not feel well.  He particularly feels ill with increased exertion, after walking for even short period of time.  He does feel dizzy while driving, particularly when he makes sharp turns.  When ambulating, he feels like veering towards the left, particularly when turning quickly.  He reports intermittent ringing in his right ear.  With prolonged exertion, he reports problems with memory.  He once went into [employer] for 10 minutes and then forgot where he parked his  car.  He sometimes forgets when his daughter is scheduled for dance class.  Over Christmas, he was staying at a hotel and got confused about which button to press in the elevator.  Sometimes, he will not recognize people he knows casually, but denies problems with face recognition of people he has known for years.     During his coronary CT angiogram in January, he was noted to have a lung nodule.  CT of abdomen and pelvis showed small sclerotic lesions throughout the pelvic bones.   TSH and cortisol were normal.  Testosterone was only borderline low.    PAST MEDICAL HISTORY: Past Medical History:  Diagnosis Date  . Inguinal hernia     MEDICATIONS: Current Outpatient Medications on File Prior to Visit  Medication Sig Dispense Refill  . atorvastatin (LIPITOR) 20 MG tablet Take 1 tablet (20 mg total) by mouth daily. 30 tablet 3  . cetirizine (ZYRTEC) 10 MG tablet Take 1 tablet (10 mg total) by mouth daily. 30 tablet 3  . FLUoxetine (PROZAC) 20 MG tablet Take 1 tablet (20 mg total) by mouth daily. 30 tablet 3   No current facility-administered medications on file prior to visit.     ALLERGIES: Allergies  Allergen Reactions  . Codeine Itching    FAMILY HISTORY: Family History  Problem Relation Age of Onset  . Kidney disease Father   . Arrhythmia Mother   . Irregular heart beat Brother   . Colon cancer Neg Hx   . Liver cancer Neg Hx   . Celiac disease Neg Hx   . Inflammatory bowel  disease Neg Hx     SOCIAL HISTORY: Social History   Socioeconomic History  . Marital status: Single    Spouse name: Not on file  . Number of children: Not on file  . Years of education: Not on file  . Highest education level: Not on file  Occupational History  . Not on file  Social Needs  . Financial resource strain: Not on file  . Food insecurity:    Worry: Not on file    Inability: Not on file  . Transportation needs:    Medical: Not on file    Non-medical: Not on file  Tobacco Use    . Smoking status: Former Smoker    Types: Cigarettes    Last attempt to quit: 08/31/2009    Years since quitting: 8.3  . Smokeless tobacco: Never Used  Substance and Sexual Activity  . Alcohol use: Yes    Comment: socially. Not daily or weekly.  . Drug use: Yes    Types: Marijuana    Comment: occ  . Sexual activity: Not on file  Lifestyle  . Physical activity:    Days per week: Not on file    Minutes per session: Not on file  . Stress: Not on file  Relationships  . Social connections:    Talks on phone: Not on file    Gets together: Not on file    Attends religious service: Not on file    Active member of club or organization: Not on file    Attends meetings of clubs or organizations: Not on file    Relationship status: Not on file  . Intimate partner violence:    Fear of current or ex partner: Not on file    Emotionally abused: Not on file    Physically abused: Not on file    Forced sexual activity: Not on file  Other Topics Concern  . Not on file  Social History Narrative  . Not on file    REVIEW OF SYSTEMS: Constitutional: No fevers, chills, or sweats, no generalized fatigue, change in appetite Eyes: No visual changes, double vision, eye pain Ear, nose and throat: No hearing loss, ear pain, nasal congestion, sore throat Cardiovascular: No chest pain, palpitations Respiratory:  No shortness of breath at rest or with exertion, wheezes GastrointestinaI: No nausea, vomiting, diarrhea, abdominal pain, fecal incontinence Genitourinary:  No dysuria, urinary retention or frequency Musculoskeletal:  No neck pain, back pain Integumentary: No rash, pruritus, skin lesions Neurological: as above Psychiatric: anxiety, some depression Endocrine: No palpitations, fatigue, diaphoresis, mood swings, change in appetite, change in weight, increased thirst Hematologic/Lymphatic:  No purpura, petechiae. Allergic/Immunologic: no itchy/runny eyes, nasal congestion, recent allergic  reactions, rashes  PHYSICAL EXAM: Vitals:   01/20/18 1110  BP: 128/88  Pulse: 92  SpO2: 97%   General: No acute distress.   Head:  Normocephalic/atraumatic Eyes:  Fundi examined but not visualized Neck: supple, no paraspinal tenderness, full range of motion Heart:  Regular rate and rhythm Lungs:  Clear to auscultation bilaterally Back: No paraspinal tenderness Neurological Exam: alert and oriented to person, place, and time. Attention span and concentration intact, recent and remote memory intact, fund of knowledge intact.  Speech fluent and not dysarthric, language intact.  CN II-XII intact. Bulk and tone normal, muscle strength 5/5 throughout.  Slight decrease in pinprick sensation in big toes.  Vibration sensation intact.  Deep tendon reflexes 2+ throughout, toes downgoing.  Finger to nose and heel to shin testing intact.  Gait  normal, Romberg negative.  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  Adjustment disorder with depression and anxiety   PLAN: 1.  Vestibular rehab 2.  Follow up afterward  25 minutes spent face to face with patient, over 50% spent discussing management.  Shon Millet, DO  CC:  Dr. Alvis Lemmings

## 2018-01-20 NOTE — Patient Instructions (Signed)
1.  For the vertigo, I will send you for vestibular rehabilitation 2.  Follow up in 3 months.

## 2018-01-28 ENCOUNTER — Ambulatory Visit: Payer: No Typology Code available for payment source | Attending: Neurology | Admitting: Physical Therapy

## 2018-01-28 ENCOUNTER — Encounter: Payer: Self-pay | Admitting: Physical Therapy

## 2018-01-28 DIAGNOSIS — R42 Dizziness and giddiness: Secondary | ICD-10-CM

## 2018-01-28 DIAGNOSIS — H8113 Benign paroxysmal vertigo, bilateral: Secondary | ICD-10-CM | POA: Insufficient documentation

## 2018-01-28 DIAGNOSIS — R2681 Unsteadiness on feet: Secondary | ICD-10-CM | POA: Insufficient documentation

## 2018-01-28 NOTE — Therapy (Addendum)
[facility] [address] [place], [place], [address] Phone: [phone]   Fax:  [phone]  Physical Therapy Evaluation  Patient Details  Name: Louis Brown MRN: [id number] Date of Birth: 04-05-1961 Referring Provider:  Drema DallasJaffe, Adam R, DO   Encounter Date: 01/28/2018    PT End of Session - 01/29/18 2020    Visit Number  1    Number of Visits  17    Date for PT Re-Evaluation  03/26/18    Authorization Type  [employer] 100% coverage 01/10/18 to 07/03/18    PT Start Time  1028 late start due to pt not properly checked in at arrival    PT Stop Time  1112    PT Time Calculation (min)  44 min    Activity Tolerance  Other (comment) stated imbalance worse (staggered twice walking to lobby)    Behavior During Therapy  [facility]       Past Medical History:  Diagnosis Date  . Inguinal hernia     Past Surgical History:  Procedure Laterality Date  . IR RADIOLOGIST EVAL & MGMT  09/16/2017  . KNEE ARTHROSCOPY    . KNEE SURGERY      There were no vitals filed for this visit.   Subjective Assessment - 01/29/18 2111    Subjective  This really started spring 2018. Noticed extreme fatigue at work, excessive sweating (like could wring the sweat out of my clothes), and dizziness when turning car left or right. 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 Stopped working, went home, and the next morning still off balance and went to the ED. They only found low HR (MRI negative) and referred to cardiologist. I've been referred to multiple specialists over the past 9 months and no one can give me a diagnosis. I don't think they believe me and think it's all in my head.       Pertinent History  depression and anxiety; 04/24/17 to ED for above symptoms & found to have episodes of bradycardia; episodes of HTN (Oct 2018 stress test stopped at 6 min due to BP 214/111 (pre-test 139/90)--pt reports now anytime he tries to  exercise his BP shoots up); Normal brain MRI 04/24/17 & 11/13/17; 06/30/17 reported addition of tinnitus rt ear and facial amnesia to PCP with referral to ENT & neurologist: 08/2017  very low Vitamin D (can contribute to muscle aches, anxiety, fatigue, and depression); 10/2017 CT chest and abd--Right middle lobe solid pulmonary nodule; sclerotic lesions throughout the pelvic bones (in April 2019 orthopedic consult found multiple pelvidc sclerotic lesions not suspicious for malignancy); 2/18 Neuro consult: feeling off balance with undulating sensation; When ambulating, he feels like he's veering towards the left; 5/7 Neurocognitive testing normal; expect changes in cognition/memory due to depression/anxiety; 5/23 Neuro appt  periodic brief shocks in his toes; Pt reports he paid independent lab to test for Lyme and negative    Diagnostic tests  brain MRI negative Aug '18 & March '19; stress test normal cardiac function (was stopped due to elevated BP);     Patient Stated Goals  stop feeling dizzy so I can return to working and spending time with his 358 yr old daughter (hiking, fishing)    Currently in Pain?  Yes    Pain Location  Toe (Comment which one) bil 1-2    Pain Orientation  Right;Left    Pain Descriptors / Indicators  Burning;Sharp    Pain Type  Acute pain  Pain Onset  1 to 4 weeks ago    Pain Frequency  Intermittent    Aggravating Factors   unknown    Pain Relieving Factors  unknown               [facility] PT Assessment - 01/29/18 2114      Assessment   Medical Diagnosis  Rt BPPV, dizziness    Referring Provider   Drema Dallas, DO    Onset Date/Surgical Date  -- March 2018    Prior Therapy  none      Precautions   Precautions  Fall      Balance Screen   Has the patient fallen in the past 6 months  Yes    How many times?  2 both times had bent down and as standing up incr dizzinees    Has the patient had a decrease in activity level because of a fear of falling?   No    Is the  patient reluctant to leave their home because of a fear of falling?   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    Leisure  hiking, fishing      Cognition   Overall Cognitive Status  Impaired/Different from baseline    Area of Impairment  Memory    Memory  Decreased short-term memory    Memory Comments  especially facial recognition      Observation/Other Assessments   Focus on Therapeutic Outcomes (FOTO)   not set up      Sensation   Light Touch  Impaired by gross assessment pt reports burning 1-2 toes bil      Posture/Postural Control   Posture/Postural Control  No significant limitations           Vestibular Assessment - 01/29/18 2115      Vestibular Assessment   General Observation  walks with wide BOS; after Epley, staggering to his left as assisted to the lobby      Symptom Behavior   Type of Dizziness  Spinning always falling to the left    Frequency of Dizziness  daily    Duration of Dizziness  30-40 seconds    Aggravating Factors  Forward bending    Relieving Factors  Comments tried meclizind with no improvement      Occulomotor Exam   Occulomotor Alignment  Normal    Spontaneous  Absent    Gaze-induced  Absent    Smooth Pursuits  Comment in left upper quadrant left eye drifts laterally off target      Auditory   Comments  pt reports intermittent tinnitus; reports in loud areas it is worse (denies incr dizziness with loud noises)      Other Tests   Comments  pt does report ear fullness and "pressure changes" with Valsalva maneuver      Positional Testing   Dix-Hallpike  Dix-Hallpike Right;Dix-Hallpike Left      Dix-Hallpike Right   Dix-Hallpike Right Duration  3-5 sec delay; 40 sec    Dix-Hallpike Right Symptoms  Upbeat, right rotatory nystagmus      Dix-Hallpike Left   Dix-Hallpike Left Duration  no nystagmus, +spinning sensation;      Dix-Hallpike Left Symptoms  No nystagmus          Objective measurements completed on examination: See above findings.  Vestibular Treatment/Exercise - 01/29/18 2116      Vestibular Treatment/Exercise   Vestibular Treatment Provided  Canalith Repositioning    Canalith Repositioning  Epley Manuever Right;Epley Manuever Left    Habituation Exercises  Francee Piccolo Daroff       EPLEY MANUEVER RIGHT   Number of Reps   2    Overall Response  Symptoms Worsened    Response Details   !st rep: step 1 spinning (nystagmus not visible), step 2 upbeating, rt rotation, step 3 most severe nystagmus and spinning; 2nd rep: minimal nystagmus each step until return to sit reported spinning (but less severe)       EPLEY MANUEVER LEFT   Number of Reps   1    Overall Response   Symptoms Worsened      Austin Miles   Symptom Description   demonstrated for pt and given handout            PT Education - 01/29/18 2016    Education Details  results of limited/incomplete evaluation (due to late start) +for BPPV, however this does not explain all his symptoms; recommend referral to ENT with vestibular specialty ([facility] or [facility] clinics); recommend return to PT for further assessment/treatment, including balance training; Brandt-Daroff for HEP    Person(s) Educated  Patient    Methods  Explanation;Demonstration;Handout    Comprehension  Verbalized understanding       PT Short Term Goals - 01/29/18 2100      PT SHORT TERM GOAL #1   Title  Negative positional testing for BPPV (Target all STGs 02/25/18)    Time  4    Period  Weeks    Status  New    Target Date  02/25/18      PT SHORT TERM GOAL #2   Title  Complete balance assessment (FGA vs Berg vs SOT) as appropriate. Additional STG or LTG to be set based on results    Time  4    Period  Weeks    Status  New      PT SHORT TERM GOAL #3   Title  patient to be independent with HEP    Time  4    Period  Weeks    Status  New         PT Long Term Goals - 01/29/18 2103      PT LONG TERM GOAL #1   Title  Patient to be independent with updated HEP (Target for all LTGs 03/25/18)    Time  8    Period  Weeks    Status  New    Target Date  03/25/18      PT LONG TERM GOAL #2   Title  Patient able to ambulate >=1000 ft over unlevel terrain (including over knee high obstacles, sudden turns or head turns) modified independent (slower velocity than normal)    Time  8    Period  Weeks    Status  New             Plan - 01/29/18 2025    Clinical Impression Statement  Patient referred to PT due to dizziness and rt BPPV by neurologist. Patient has had a complicated course of events over 9-12 months (patient states symptoms actually began 12 months ago; first ED visit 9 months ago). Patient reports dizziness that includes general imbalance, ears feel "full, pressure" with varying degrees, periods of spinning/vertigo lasting <60 seconds but lingering sensation of "rocking on a boat" for hours;  intermittent tinnitus rt ear, and has noticed that when in loud, noisy area the feeling of ear fullness and hearing are worse. Patient's symptoms also include fatigue/exhaustion with minimal activity, episodes of near-syncope with vision "going tunnel-like and then black", decreased memory, decreased attention, and burning sensation in 1st-2nd toes bilaterally, He has been diagnosed with depression and anxiety since onset of symptoms and has been unable to work as a Administrator. Patient did test positive for rt BPPV and demonstrated less nystagmus and reported less severe spinning on 2nd repetition of Epley. Patient however experienced incr nausea and imbalance after treatment (to the point he remained sitting in clinic lobby x 2 hours after session unable to drive himself home). Patient should benefit from PT to address BPPV and imbalance (and other deficits listed below) via the interventions listed below. Will also communicate to referring  neurologist that an ENT consult is recommended.     History and Personal Factors relevant to plan of care:  PMH-depression, anxiety, periods of bradycardia    Personal factors-limited support (in fact separated from daughter's mother and in custody dispute); stress related to inability to work and potential loss of home    Clinical Presentation  Evolving    Clinical Presentation due to:  diagnosis unknown; symptoms worsening overall    Clinical Decision Making  Moderate    Rehab Potential  Fair    Clinical Impairments Affecting Rehab Potential  unknown diagnosis; symptoms ongoing/worsening over 12 month period    PT Frequency  2x / week    PT Duration  8 weeks    PT Treatment/Interventions  ADLs/Self Care Home Management;Canalith Repostioning;Gait training;DME Instruction;Therapeutic activities;Therapeutic exercise;Balance training;Neuromuscular re-education;Patient/family education;Manual techniques;Vestibular;Visual/perceptual remediation/compensation    PT Next Visit Plan  complete vestibular assessment (see vestibular assessment-saccades, test of skew, VOR cancellation, HIT, etc with Frenzel lenses where approp); recheck rt posterior BPPV; check his technique for Brandt-Daroff if remains approp; forward PN to Dr. Everlena Cooper if further diagnostic information obtained    Recommended Other Services  ENT referral    Consulted and Agree with Plan of Care  Patient       Patient will benefit from skilled therapeutic intervention in order to improve the following deficits and impairments:  Decreased activity tolerance, Decreased balance, Decreased cognition, Decreased mobility, Dizziness, Difficulty walking, Impaired sensation, Other (comment)(vertigo)  Visit Diagnosis: BPPV (benign paroxysmal positional vertigo), bilateral - Plan: PT plan of care cert/re-cert  Unsteadiness on feet - Plan: PT plan of care cert/re-cert  Dizziness and giddiness - Plan: PT plan of care cert/re-cert     Problem  List Patient Active Problem List   Diagnosis Date Noted  . Rectal bleeding 01/14/2018  . Anxiety and depression 11/11/2017  . 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 Mixed dyslipidemia 09/16/2017  . DOE (dyspnea on exertion) 07/29/2017  . Dizziness 05/07/2017  . Bradycardia 05/07/2017  . Inguinal hernia 03/27/2015  . Sebaceous cyst 03/27/2015    Zena Amos , PT 01/29/2018, 9:17 PM  [facility] [address] [place], [place], [address] Phone: [phone]   Fax:  [phone]  Name: Ferman Basilio MRN: [id number] Date of Birth: 1961-02-06

## 2018-01-29 ENCOUNTER — Other Ambulatory Visit: Payer: Self-pay

## 2018-01-29 NOTE — Patient Instructions (Signed)
Given handout for Rt Epley and Brandt-Daroff home exercises

## 2018-01-29 NOTE — Addendum Note (Signed)
Addended by: Zena AmosSASSER, Ellyssa Zagal P on: 01/29/2018 09:25 PM   Modules accepted: Orders

## 2018-01-31 ENCOUNTER — Encounter: Payer: Self-pay | Admitting: Physical Therapy

## 2018-01-31 ENCOUNTER — Ambulatory Visit: Payer: No Typology Code available for payment source | Attending: Neurology | Admitting: Physical Therapy

## 2018-01-31 DIAGNOSIS — H8113 Benign paroxysmal vertigo, bilateral: Secondary | ICD-10-CM | POA: Insufficient documentation

## 2018-01-31 DIAGNOSIS — R2681 Unsteadiness on feet: Secondary | ICD-10-CM | POA: Insufficient documentation

## 2018-01-31 DIAGNOSIS — R42 Dizziness and giddiness: Secondary | ICD-10-CM | POA: Insufficient documentation

## 2018-01-31 NOTE — Patient Instructions (Signed)
Access Code: [email or web address]  URL: [email or web address]  Date: 01/31/2018  Prepared by: Veda CanningLynn Sherly Brodbeck   Exercises  Seated Gaze Stabilization with Head Rotation - 10 reps - 3 sets - 3x daily - 7x weekly

## 2018-01-31 NOTE — Therapy (Signed)
[facility] [address] [place], [place], [address] Phone: [phone]   Fax:  [phone]  Physical Therapy Treatment  Patient Details  Name: Louis Brown MRN: [id number] Date of Birth: Jan 23, 1961 Referring Provider:  Drema Dallas, DO   Encounter Date: 01/31/2018  PT End of Session - 01/31/18 1949    Visit Number  2    Number of Visits  17    Date for PT Re-Evaluation  03/26/18    Authorization Type  [employer] 100% coverage 01/10/18 to 07/03/18    PT Start Time  0935    PT Stop Time  1018    PT Time Calculation (min)  43 min    Activity Tolerance  Patient tolerated treatment well    Behavior During Therapy  [facility] for tasks assessed/performed       Past Medical History:  Diagnosis Date  . Inguinal hernia     Past Surgical History:  Procedure Laterality Date  . IR RADIOLOGIST EVAL & MGMT  09/16/2017  . KNEE ARTHROSCOPY    . KNEE SURGERY      There were no vitals filed for this visit.  Subjective Assessment - 01/31/18 0937    Subjective  I slept if off and felt better (after Vestibular assessment and treatment). Overall it's been better. Still got dizzy this morning when I first sat up,, but not as bad.     Pertinent History  depression and anxiety; 04/24/17 to ED for above symptoms & found to have episodes of bradycardia; episodes of HTN (Oct 2018 stress test stopped at 6 min due to BP 214/111 (pre-test 139/90)--pt reports now anytime he tries to exercise his BP shoots up); Normal brain MRI 04/24/17 & 11/13/17; 06/30/17 reported addition of tinnitus rt ear and facial amnesia to PCP with referral to ENT & neurologist: 08/2017  very low Vitamin D (can contribute to muscle aches, anxiety, fatigue, and depression); 10/2017 CT chest and abd--Right middle lobe solid pulmonary nodule; sclerotic lesions throughout the pelvic bones (in April 2019 orthopedic consult found multiple pelvidc sclerotic lesions not suspicious for malignancy);  2/18 Neuro consult: feeling off balance with undulating sensation; When ambulating, he feels like he's veering towards the left; 5/7 Neurocognitive testing normal; expect changes in cognition/memory due to depression/anxiety; 5/23 Neuro appt  periodic brief shocks in his toes; Pt reports he paid independent lab to test for Lyme and negative    Diagnostic tests  brain MRI negative Aug '18 & March '19; stress test normal cardiac function (was stopped due to elevated BP);     Patient Stated Goals  stop feeling dizzy so I can return to working and spending time with his 14 yr old daughter (hiking, fishing)    Pain Onset  1 to 4 weeks ago             Vestibular Assessment - 01/31/18 0940      Occulomotor Exam   Saccades  Dysmetria with repetition; left eye lags and begins to get double-visi      Vestibulo-Occular Reflex   VOR to Slow Head Movement  Normal    VOR Cancellation  Normal    Comment  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, indicative of right hypofunction      Visual Acuity   Static  10    Dynamic  8 pt struggled to read with incr time      Auditory   Comments  pt reports intermittent tinnitus; reports in loud areas the noises get  amplified and feels like ears are vibrating      Positional Testing   Dix-Hallpike  Dix-Hallpike Right    Horizontal Canal Testing  Horizontal Canal Right;Horizontal Canal Left      Dix-Hallpike Right   Dix-Hallpike Right Duration  8 sec delay; 15 sec nystagmus    Dix-Hallpike Right Symptoms  Upbeat, right rotatory nystagmus      Horizontal Canal Right   Horizontal Canal Right Duration  0    Horizontal Canal Right Symptoms  Normal      Horizontal Canal Left   Horizontal Canal Left Duration  0    Horizontal Canal Left Symptoms  Normal                Vestibular Treatment/Exercise - 01/31/18 1829      Vestibular Treatment/Exercise   Vestibular Treatment Provided  Canalith Repositioning;Gaze    Canalith Repositioning  Epley Manuever Right     Gaze Exercises  X1 Viewing Horizontal       EPLEY MANUEVER RIGHT   Number of Reps   2    Overall Response  Improved Symptoms    Response Details   barely perceptible nystagmus on first step of Epley on his second repetition; very little dizziness on final side to sit      X1 Viewing Horizontal   Foot Position  seated    Time  -- 25 seconds    Reps  3    Comments  Eyes become dysconjugate with blurry & at times double vision            PT Education - 01/31/18 1947    Education Details  results of assessment (apparent hypofunction); addition to HEP; not all symptoms can be explained by BPPV and hypofunction    Person(s) Educated  Patient    Methods  Explanation;Demonstration;Verbal cues;Handout    Comprehension  Verbalized understanding;Returned demonstration;Verbal cues required;Need further instruction       PT Short Term Goals - 01/29/18 2100      PT SHORT TERM GOAL #1   Title  Negative positional testing for BPPV (Target all STGs 02/25/18)    Time  4    Period  Weeks    Status  New    Target Date  02/25/18      PT SHORT TERM GOAL #2   Title  Complete balance assessment (FGA vs Berg vs SOT) as appropriate. Additional STG or LTG to be set based on results    Time  4    Period  Weeks    Status  New      PT SHORT TERM GOAL #3   Title  patient to be independent with HEP    Time  4    Period  Weeks    Status  New        PT Long Term Goals - 01/29/18 2103      PT LONG TERM GOAL #1   Title  Patient to be independent with updated HEP (Target for all LTGs 03/25/18)    Time  8    Period  Weeks    Status  New    Target Date  03/25/18      PT LONG TERM GOAL #2   Title  Patient able to ambulate >=1000 ft over unlevel terrain (including over knee high obstacles, sudden turns or head turns) modified independent (slower velocity than normal)    Time  8    Period  Weeks    Status  New  Plan - 01/31/18 1951    Clinical Impression Statement  Session  included further vestibular assessment (initial evalution session was short due to rehab office error) with +head impulse test to pt's right and +rt posterior BPPV. Patient responded well to Rt Epley x 2 and educated in VORx1 exercise. Remains unclear why pt has hearing changes, noise sensitivity, feeling of fullness bil ears (especially in head hanging position). Anticipate pt can benefit from treatment of BPPV, vestibular hypofunction, and balance training however continue to feel an ENT consult is indicated.     Rehab Potential  Fair    Clinical Impairments Affecting Rehab Potential  unknown diagnosis; symptoms ongoing/worsening over 12 month period    PT Frequency  2x / week    PT Duration  8 weeks    PT Treatment/Interventions  ADLs/Self Care Home Management;Canalith Repostioning;Gait training;DME Instruction;Therapeutic activities;Therapeutic exercise;Balance training;Neuromuscular re-education;Patient/family education;Manual techniques;Vestibular;Visual/perceptual remediation/compensation    PT Next Visit Plan  recheck for rt posterior BPPV; check his technique for VORx1 horizontal and progress to standing if able; check his technique for Brandt-Daroff if remains approp;     Consulted and Agree with Plan of Care  Patient       Patient will benefit from skilled therapeutic intervention in order to improve the following deficits and impairments:  Decreased activity tolerance, Decreased balance, Decreased cognition, Decreased mobility, Dizziness, Difficulty walking, Impaired sensation, Other (comment)(vertigo)  Visit Diagnosis: Dizziness and giddiness     Problem List Patient Active Problem List   Diagnosis Date Noted  . Rectal bleeding 01/14/2018  . Anxiety and depression 11/11/2017  . 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 Mixed dyslipidemia 09/16/2017  . DOE (dyspnea on exertion) 07/29/2017  . Dizziness 05/07/2017  . Bradycardia 05/07/2017  . Inguinal hernia 03/27/2015  . Sebaceous cyst  03/27/2015    Zena AmosLynn P Leul Narramore, PT 01/31/2018, 8:00 PM  [facility] [address] [place], [place], [address] Phone: [phone]   Fax:  [phone]  Name: Louis Brown MRN: [id number] Date of Birth: 12-12-1960

## 2018-02-02 NOTE — Addendum Note (Signed)
Addended by: Dorthy CoolerBURNS, SANDRA J on: 02/02/2018 07:35 AM   Modules accepted: Orders

## 2018-02-03 ENCOUNTER — Encounter: Payer: Self-pay | Admitting: Physical Therapy

## 2018-02-03 ENCOUNTER — Ambulatory Visit: Payer: No Typology Code available for payment source | Admitting: Physical Therapy

## 2018-02-03 VITALS — BP 148/112

## 2018-02-03 DIAGNOSIS — H8113 Benign paroxysmal vertigo, bilateral: Secondary | ICD-10-CM

## 2018-02-03 DIAGNOSIS — R42 Dizziness and giddiness: Secondary | ICD-10-CM

## 2018-02-03 NOTE — Therapy (Signed)
[facility] [address] [place], [place], [address] Phone: [phone]   Fax:  [phone]  Physical Therapy Treatment  Patient Details  Name: Louis Brown MRN: [id number] Date of Birth: [birth year] Referring Provider:  Drema Dallas, DO   Encounter Date: 02/03/2018  PT End of Session - 02/03/18 2048    Visit Number  3    Number of Visits  17    Date for PT Re-Evaluation  03/26/18    Authorization Type  [employer] 100% coverage 01/10/18 to 07/03/18    PT Start Time  1315    PT Stop Time  1358    PT Time Calculation (min)  43 min    Activity Tolerance  Patient tolerated treatment well    Behavior During Therapy  [facility] for tasks assessed/performed       Past Medical History:  Diagnosis Date  . Inguinal hernia     Past Surgical History:  Procedure Laterality Date  . IR RADIOLOGIST EVAL & MGMT  09/16/2017  . KNEE ARTHROSCOPY    . KNEE SURGERY      Vitals:   02/03/18 1352  BP: (!) 148/112    Subjective Assessment - 02/03/18 1318    Subjective  After last session felt almost as bad as the first time. I slept if off and felt better the next morning. Starting yesterday began having a headache "and I very rarely have a headache." Today has felt awful (diaphoretic, head feeling "off", nausea. Did not do Brandt-Daroff exercises as he needs to be able to drive to pick up his daughter and take her to dance class (afraid he would be too dizzy/sick to drive).     Pertinent History  depression and anxiety; 04/24/17 to ED for above symptoms & found to have episodes of bradycardia; episodes of HTN (Oct 2018 stress test stopped at 6 min due to BP 214/111 (pre-test 139/90)--pt reports now anytime he tries to exercise his BP shoots up); Normal brain MRI 04/24/17 & 11/13/17; 06/30/17 reported addition of tinnitus rt ear and facial amnesia to PCP with referral to ENT & neurologist: 08/2017  very low Vitamin D (can contribute to muscle aches,  anxiety, fatigue, and depression); 10/2017 CT chest and abd--Right middle lobe solid pulmonary nodule; sclerotic lesions throughout the pelvic bones (in April 2019 orthopedic consult found multiple pelvidc sclerotic lesions not suspicious for malignancy); 2/18 Neuro consult: feeling off balance with undulating sensation; When ambulating, he feels like he's veering towards the left; 5/7 Neurocognitive testing normal; expect changes in cognition/memory due to depression/anxiety; 5/23 Neuro appt  periodic brief shocks in his toes; Pt reports he paid independent lab to test for Lyme and negative    Diagnostic tests  brain MRI negative Aug '18 & March '19; stress test normal cardiac function (was stopped due to elevated BP);     Patient Stated Goals  stop feeling dizzy so I can return to working and spending time with his 37 yr old daughter (hiking, fishing)    Currently in Pain?  Yes    Pain Location  Head    Pain Descriptors / Indicators  Headache    Pain Type  Acute pain    Pain Onset  Yesterday    Pain Frequency  Constant    Aggravating Factors   perhaps due to stopping caffeine (later reports this was 2 weeks ago)             Vestibular Assessment - 02/03/18 2045  Dix-Hallpike Right   Dix-Hallpike Right Duration  5-8 second delay; 5-10 seconds nystagmus    Dix-Hallpike Right Symptoms  Upbeat, right rotatory nystagmus                 Vestibular Treatment/Exercise - 02/03/18 0001      Vestibular Treatment/Exercise   Vestibular Treatment Provided  Canalith Repositioning;Gaze    Canalith Repositioning  Epley Manuever Right       EPLEY MANUEVER RIGHT   Number of Reps   3    Overall Response  Improved Symptoms    Response Details   duration of nystagmus and intensity diminished with each repetition; final Dix-Hallpike (3rd) negative      X1 Viewing Horizontal   Foot Position  standing (after all BPPV maneuvers)    Time  -- 30 sec    Comments  now asymptomatic and  instructed to discontinue            PT Education - 02/03/18 2047    Education Details  importance of Brandt-Daroff exercises to help clear BPPV--ideally 3x/day    Person(s) Educated  Patient    Methods  Explanation;Demonstration;Verbal cues    Comprehension  Verbalized understanding;Returned demonstration;Need further instruction       PT Short Term Goals - 01/29/18 2100      PT SHORT TERM GOAL #1   Title  Negative positional testing for BPPV (Target all STGs 02/25/18)    Time  4    Period  Weeks    Status  New    Target Date  02/25/18      PT SHORT TERM GOAL #2   Title  Complete balance assessment (FGA vs Berg vs SOT) as appropriate. Additional STG or LTG to be set based on results    Time  4    Period  Weeks    Status  New      PT SHORT TERM GOAL #3   Title  patient to be independent with HEP    Time  4    Period  Weeks    Status  New        PT Long Term Goals - 01/29/18 2103      PT LONG TERM GOAL #1   Title  Patient to be independent with updated HEP (Target for all LTGs 03/25/18)    Time  8    Period  Weeks    Status  New    Target Date  03/25/18      PT LONG TERM GOAL #2   Title  Patient able to ambulate >=1000 ft over unlevel terrain (including over knee high obstacles, sudden turns or head turns) modified independent (slower velocity than normal)    Time  8    Period  Weeks    Status  New            Plan - 02/03/18 2050    Clinical Impression Statement  Patient arrived feeling worse today (including warm to touch and diaphoretic). Second PT Rosalita Chessman(Suzanne [phone]) available to further assess patient's vestibular symptoms as his BPPV has been difficult to clear. Reviewed PT evaluation and treatment thus far and pt's recent feeling worse with headache and joint aches. Completed rt Dix-Hallpike with pt remaining + for rt posterior canal BPPV. Completed rt Epley x 3 reps followed by repeated Dix-Hallpike x 2 with ultimately negative test. Patient's BP  assessed after multiple maneuvers and was 148/112. Next visit plan to repeat orthostatics first, then allow pt to work  on the elliptical and record BP during exercise.     Rehab Potential  Fair    Clinical Impairments Affecting Rehab Potential  unknown diagnosis; symptoms ongoing/worsening over 12 month period    PT Frequency  2x / week    PT Duration  8 weeks    PT Treatment/Interventions  ADLs/Self Care Home Management;Canalith Repostioning;Gait training;DME Instruction;Therapeutic activities;Therapeutic exercise;Balance training;Neuromuscular re-education;Patient/family education;Manual techniques;Vestibular;Visual/perceptual remediation/compensation    PT Next Visit Plan  start with orthostatics then BP while exerciseing; recheck for rt posterior BPPV; check his technique for Brandt-Daroff if remains approp; balance testing    Consulted and Agree with Plan of Care  Patient       Patient will benefit from skilled therapeutic intervention in order to improve the following deficits and impairments:  Decreased activity tolerance, Decreased balance, Decreased cognition, Decreased mobility, Dizziness, Difficulty walking, Impaired sensation, Other (comment)(vertigo)  Visit Diagnosis: Dizziness and giddiness  BPPV (benign paroxysmal positional vertigo), bilateral     Problem List Patient Active Problem List   Diagnosis Date Noted  . Rectal bleeding 01/14/2018  . Anxiety and depression 11/11/2017  . 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 Mixed dyslipidemia 09/16/2017  . DOE (dyspnea on exertion) 07/29/2017  . Dizziness 05/07/2017  . Bradycardia 05/07/2017  . Inguinal hernia 03/27/2015  . Sebaceous cyst 03/27/2015    Zena Amos, PT 02/03/2018, 9:03 PM  [facility] [address] [place], [place], [address] Phone: [phone]   Fax:  [phone]  Name: Louis Brown MRN: [id number] Date of Birth: 03/22/1961

## 2018-02-04 ENCOUNTER — Encounter

## 2018-02-08 ENCOUNTER — Encounter: Payer: Self-pay | Admitting: Physical Therapy

## 2018-02-08 ENCOUNTER — Ambulatory Visit: Payer: No Typology Code available for payment source | Admitting: Physical Therapy

## 2018-02-08 DIAGNOSIS — R42 Dizziness and giddiness: Secondary | ICD-10-CM

## 2018-02-08 DIAGNOSIS — R2681 Unsteadiness on feet: Secondary | ICD-10-CM

## 2018-02-08 NOTE — Therapy (Signed)
[facility] [address] [place], [place], [address] Phone: [phone]   Fax:  [phone]  Physical Therapy Treatment  Patient Details  Name: Louis Brown MRN: [id number] Date of Birth: Mar 03, 1961 Referring Provider:  Drema DallasJaffe, Adam R, DO   Encounter Date: 02/08/2018  PT End of Session - 02/08/18 0922    Visit Number  4    Number of Visits  17    Date for PT Re-Evaluation  03/26/18    Authorization Type  GCC[employer] 100% coverage 01/10/18 to 07/03/18    PT Start Time  0805    PT Stop Time  0847    PT Time Calculation (min)  42 min    Activity Tolerance  Treatment limited secondary to medical complications (Comment) BP elevated 160/114 (after orthostatics)    Behavior During Therapy  [facility] for tasks assessed/performed       Past Medical History:  Diagnosis Date  . Inguinal hernia     Past Surgical History:  Procedure Laterality Date  . IR RADIOLOGIST EVAL & MGMT  09/16/2017  . KNEE ARTHROSCOPY    . KNEE SURGERY      There were no vitals filed for this visit.  Subjective Assessment - 02/08/18 0807    Subjective  Has been doing Brandt-Daroff 1-3 times per day. Only twice has it set off severe spinning (and it wasn't until the 3rd rep that it started). Overall, his dizziness is better however his rt ear feels like it is stopped up all the time now. Left ear comes and goes. Walked 2 blocks yesterday and very fatigued. Felt bad last time when he left here but next day felt pretty good. Has noticed when it's hot outside it feels like his body is "cooking" and it doesn't cool right (diaphoretic, flushed). .    Pertinent History  depression and anxiety; 04/24/17 to ED for above symptoms & found to have episodes of bradycardia; episodes of HTN (Oct 2018 stress test stopped at 6 min due to BP 214/111 (pre-test 139/90)--pt reports now anytime he tries to exercise his BP shoots up); Normal brain MRI 04/24/17 & 11/13/17; 06/30/17 reported  addition of tinnitus rt ear and facial amnesia to PCP with referral to ENT & neurologist: 08/2017  very low Vitamin D (can contribute to muscle aches, anxiety, fatigue, and depression); 10/2017 CT chest and abd--Right middle lobe solid pulmonary nodule; sclerotic lesions throughout the pelvic bones (in April 2019 orthopedic consult found multiple pelvidc sclerotic lesions not suspicious for malignancy); 2/18 Neuro consult: feeling off balance with undulating sensation; When ambulating, he feels like he's veering towards the left; 5/7 Neurocognitive testing normal; expect changes in cognition/memory due to depression/anxiety; 5/23 Neuro appt  periodic brief shocks in his toes; Pt reports he paid independent lab to test for Lyme and negative    Diagnostic tests  brain MRI negative Aug '18 & March '19; stress test normal cardiac function (was stopped due to elevated BP);     Patient Stated Goals  stop feeling dizzy so I can return to working and spending time with his 808 yr old daughter (hiking, fishing)    Pain Onset  Louis CruiseYesterday                       [facility] Adult PT Treatment/Exercise - 02/08/18 0920      Standardized Balance Assessment   Standardized Balance Assessment  Balance Master Testing      Sensory Organization Testing=61% compared to age/height  normative value of 71%.     Patient demonstrated scores of:   74 for use of somatosensory feedback for balance (compared to age/height normative value of 75),  88 for use of visual feedback for balance (compared to age/height normative value of 74),   22 for use of vestibular feedback for balance (compared to age/height normative value of 55).   COG readings were biased anteriorly consistently.           PT Education - 02/08/18 0920    Education Details  results of orthostatic BPs and do not feel safe putting him on a treadmill to assess response to exercise (he reports it will elevate even further); results of Balancemaster  SOT; will relay info to Dr. Alvis Lemmings and Dr. Everlena Cooper and perhaps only need one more visit for balance ex's until he has a diagnosis    Person(s) Educated  Patient    Methods  Explanation;Handout    Comprehension  Verbalized understanding       PT Short Term Goals - 01/29/18 2100      PT SHORT TERM GOAL #1   Title  Negative positional testing for BPPV (Target all STGs 02/25/18)    Time  4    Period  Weeks    Status  New    Target Date  02/25/18      PT SHORT TERM GOAL #2   Title  Complete balance assessment (FGA vs Berg vs SOT) as appropriate. Additional STG or LTG to be set based on results    Time  4    Period  Weeks    Status  New      PT SHORT TERM GOAL #3   Title  patient to be independent with HEP    Time  4    Period  Weeks    Status  New        PT Long Term Goals - 01/29/18 2103      PT LONG TERM GOAL #1   Title  Patient to be independent with updated HEP (Target for all LTGs 03/25/18)    Time  8    Period  Weeks    Status  New    Target Date  03/25/18      PT LONG TERM GOAL #2   Title  Patient able to ambulate >=1000 ft over unlevel terrain (including over knee high obstacles, sudden turns or head turns) modified independent (slower velocity than normal)    Time  8    Period  Weeks    Status  New            Plan - 02/08/18 0925    Clinical Impression Statement  Plan today was to assess patient's orthostatic blood pressure and exercise blood pressure. After lying supine x 5 minutes his BP was 164/102, sitting 165/110, standing 152/112 and after standing 3 minutes 160/114. (His HR maintained 74-80 bpm throughout test). Did not assess BP with exercise due to elevated DBP >100 (and pt had significant incr with BP during cardiology treadmill stress test in October 2018). Did complete Sensory Organization Test to further assess the 3 systems contributing to maintaining balance. Overall his vestibular system is not functioning at a normal level (is actually less than  half of normal) and he is using his vision to compensate. (See note for specific score). We still do not know what has caused his decline in his vestibular system and for that reason would recommend a referral to an ENT.  At this point, educated pt that I could give him exercises for his balance but cannot guarantee these will improve his symptoms as we do not know the cause. I informed him I would pass this information to his PCP and referring physician Dr. Everlena Cooper.     Rehab Potential  Fair    Clinical Impairments Affecting Rehab Potential  unknown diagnosis; symptoms ongoing/worsening over 12 month period    PT Frequency  2x / week    PT Duration  8 weeks    PT Treatment/Interventions  ADLs/Self Care Home Management;Canalith Repostioning;Gait training;DME Instruction;Therapeutic activities;Therapeutic exercise;Balance training;Neuromuscular re-education;Patient/family education;Manual techniques;Vestibular;Visual/perceptual remediation/compensation    PT Next Visit Plan  Give pt corner exercises with EC and instruct in progression (feet apart, together, tandem, and adding head turns); educated will likely d/c next visit with HEP and after he has diagnosis he may be referred back to [place] if appropriate.     Recommended Other Services  ENT referral    Consulted and Agree with Plan of Care  Patient       Patient will benefit from skilled therapeutic intervention in order to improve the following deficits and impairments:  Decreased activity tolerance, Decreased balance, Decreased cognition, Decreased mobility, Dizziness, Difficulty walking, Impaired sensation, Other (comment)(vertigo)  Visit Diagnosis: Dizziness and giddiness  Unsteadiness on feet     Problem List Patient Active Problem List   Diagnosis Date Noted  . Rectal bleeding 01/14/2018  . Anxiety and depression 11/11/2017  . 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 Mixed dyslipidemia 09/16/2017  . DOE (dyspnea on exertion) 07/29/2017  .  Dizziness 05/07/2017  . Bradycardia 05/07/2017  . Inguinal hernia 03/27/2015  . Sebaceous cyst 03/27/2015    Zena Amos, PT 02/08/2018, 1:47 PM  [facility] [address] [place], [place], [address] Phone: [phone]   Fax:  [phone]  Name: Louis Brown MRN: [id number] Date of Birth: 11/26/1960

## 2018-02-10 ENCOUNTER — Encounter: Payer: Self-pay | Admitting: Neurology

## 2018-02-10 ENCOUNTER — Encounter: Payer: Self-pay | Admitting: Family Medicine

## 2018-02-11 ENCOUNTER — Encounter: Payer: No Typology Code available for payment source | Admitting: Physical Therapy

## 2018-02-14 ENCOUNTER — Encounter: Payer: Self-pay | Admitting: Family Medicine

## 2018-02-14 MED FILL — ?FLUOXETINE HCL 20MG TABLET: 20 | 30 days supply | Qty: 30 | Fill #3

## 2018-02-15 ENCOUNTER — Encounter: Payer: Self-pay | Admitting: Physical Therapy

## 2018-02-15 ENCOUNTER — Ambulatory Visit: Payer: Self-pay | Admitting: Physical Therapy

## 2018-02-15 VITALS — BP 132/100

## 2018-02-15 DIAGNOSIS — R2681 Unsteadiness on feet: Secondary | ICD-10-CM

## 2018-02-15 NOTE — Therapy (Signed)
[facility] [address] [place], [place], [address] Phone: [phone]   Fax:  [phone]  Physical Therapy Treatment  Patient Details  Name: Louis Brown MRN: [id number] Date of Birth: Oct 23, 1960 Referring Provider:  Drema Dallas, DO   Encounter Date: 02/15/2018  PT End of Session - 02/15/18 1046    Visit Number  5    Number of Visits  17    Date for PT Re-Evaluation  03/26/18    Authorization Type  GCCN 100% coverage 01/10/18 to 07/03/18    PT Start Time  0758    PT Stop Time  0846    PT Time Calculation (min)  48 min    Activity Tolerance  Patient tolerated treatment well    Behavior During Therapy  [facility] for tasks assessed/performed       Past Medical History:  Diagnosis Date  . Inguinal hernia     Past Surgical History:  Procedure Laterality Date  . IR RADIOLOGIST EVAL & MGMT  09/16/2017  . KNEE ARTHROSCOPY    . KNEE SURGERY      Vitals:   02/15/18 0811  BP: (!) 132/100 (seated at rest)   Treadmill- HR 93  Up to 103 with velocity 1.0 up to 2.5 mph After 6 min 180/112 with HR 116  138/98 seated rest x 2 minutes   Neuro re-ed-Educated on corner vestibular exercises with EC and how to progress the difficulty of the exercises as he improves.    Subjective Assessment - 02/15/18 0758    Subjective  I heard back from Dr. Everlena Cooper and he is going to refer me to an ENT. Sent a message thru my chart to Dr. Alvis Lemmings and have not heard anything from her. Checked his BPs at home and it's been better. Interestingly I ran out of Prozac this weekend and didn't take any of the medicines she has prescribed and my BP came down. I refilled the Prozac yesterday and went to [employer] just after I took it. And I was able to tolerate being on my feet for over an hour and felt good. Went home and BP 120s/90s. Balance has not been as bad recently. Pressure-washed the house this weekend and chose not to get on the ladder.     Pertinent History  depression and anxiety; 04/24/17 to ED for above symptoms & found to have episodes of bradycardia; episodes of HTN (Oct 2018 stress test stopped at 6 min due to BP 214/111 (pre-test 139/90)--pt reports now anytime he tries to exercise his BP shoots up); Normal brain MRI 04/24/17 & 11/13/17; 06/30/17 reported addition of tinnitus rt ear and facial amnesia to PCP with referral to ENT & neurologist: 08/2017  very low Vitamin D (can contribute to muscle aches, anxiety, fatigue, and depression); 10/2017 CT chest and abd--Right middle lobe solid pulmonary nodule; sclerotic lesions throughout the pelvic bones (in April 2019 orthopedic consult found multiple pelvidc sclerotic lesions not suspicious for malignancy); 2/18 Neuro consult: feeling off balance with undulating sensation; When ambulating, he feels like he's veering towards the left; 5/7 Neurocognitive testing normal; expect changes in cognition/memory due to depression/anxiety; 5/23 Neuro appt  periodic brief shocks in his toes; Pt reports he paid independent lab to test for Lyme and negative    Diagnostic tests  brain MRI negative Aug '18 & March '19; stress test normal cardiac function (was stopped due to elevated BP);     Patient Stated Goals  stop feeling dizzy so I can  return to working and spending time with his 258 yr old daughter (hiking, fishing)    Currently in Pain?  No/denies    Pain Onset  --                               PT Education - 02/15/18 1045    Education Details  follow up with Dr. Moises BloodJaffe's office re: ENT referral; f/u with Dr. Alvis LemmingsNewlin re: elevated BPs. HEP to address vestibular deficits and fact they may or may not benefit him as the cause of his vestibular problems still unknown    Person(s) Educated  Patient    Methods  Explanation;Demonstration;Handout    Comprehension  Verbalized understanding;Returned demonstration       PT Short Term Goals - 01/29/18 2100      PT SHORT TERM GOAL #1    Title  Negative positional testing for BPPV (Target all STGs 02/25/18)    Time  4    Period  Weeks    Status  New    Target Date  02/25/18      PT SHORT TERM GOAL #2   Title  Complete balance assessment (FGA vs Berg vs SOT) as appropriate. Additional STG or LTG to be set based on results    Time  4    Period  Weeks    Status  New      PT SHORT TERM GOAL #3   Title  patient to be independent with HEP    Time  4    Period  Weeks    Status  New        PT Long Term Goals - 01/29/18 2103      PT LONG TERM GOAL #1   Title  Patient to be independent with updated HEP (Target for all LTGs 03/25/18)    Time  8    Period  Weeks    Status  New    Target Date  03/25/18      PT LONG TERM GOAL #2   Title  Patient able to ambulate >=1000 ft over unlevel terrain (including over knee high obstacles, sudden turns or head turns) modified independent (slower velocity than normal)    Time  8    Period  Weeks    Status  New            Plan - 02/15/18 1048    Clinical Impression Statement  BP assessed at rest and with walking on the treadmill to assess response to activity. Patient continues with elevated DBP even at rest and SBP elevates more than would be expected for the amount of work performed (6 minutes walking on treadmill gradually increasing 0.5 up to 2.0 mph and up to 2% elevation). Educated in corner balance exercises to focus on improving his vestibular system for improved balance. Discussed making follow-up PT appointment 4 weeks from today to reassess tolerance for exercises and ?need for update (especially if he has seen ENT and has a diagnosis). Patient in agreement.     Rehab Potential  Fair    Clinical Impairments Affecting Rehab Potential  unknown diagnosis; symptoms ongoing/worsening over 12 month period    PT Frequency  2x / week    PT Duration  8 weeks    PT Treatment/Interventions  ADLs/Self Care Home Management;Canalith Repostioning;Gait training;DME  Instruction;Therapeutic activities;Therapeutic exercise;Balance training;Neuromuscular re-education;Patient/family education;Manual techniques;Vestibular;Visual/perceptual remediation/compensation    PT Next Visit Plan  Has he seen  ENT?; any changes in medical management of his BP?; find out how doing with corner exercises with EC for vestibular training;     Consulted and Agree with Plan of Care  Patient       Patient will benefit from skilled therapeutic intervention in order to improve the following deficits and impairments:  Decreased activity tolerance, Decreased balance, Decreased cognition, Decreased mobility, Dizziness, Difficulty walking, Impaired sensation, Other (comment)(vertigo)  Visit Diagnosis: Unsteadiness on feet     Problem List Patient Active Problem List   Diagnosis Date Noted  . Rectal bleeding 01/14/2018  . Anxiety and depression 11/11/2017  . 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 Mixed dyslipidemia 09/16/2017  . DOE (dyspnea on exertion) 07/29/2017  . Dizziness 05/07/2017  . Bradycardia 05/07/2017  . Inguinal hernia 03/27/2015  . Sebaceous cyst 03/27/2015    Zena Amos, PT 02/15/2018, 10:54 AM  [facility] [address] [place], [place], [address] Phone: [phone]   Fax:  [phone]  Name: Louis Brown MRN: [id number] Date of Birth: [birth year]

## 2018-02-15 NOTE — Patient Instructions (Signed)
  Feet Apart (Compliant Surface) Head Motion - Eyes Closed    Stand on compliant surface:  with feet shoulder width apart. Close eyes 60 seconds; then add moving head slowly, up and down, left and right, and diagonally  Repeat 10 of each direction per session. Do 1-2 sessions per day.   Feet Together (Compliant Surface) Head Motion - Eyes Closed    Stand on compliant surface:  with feet together. Close eyes 60 seconds; then add moving head slowly, up and down, left and right, and diagonally  Repeat 10 of each direction per session. Do 1-2 sessions per day.   Feet Partial Heel-Toe (Compliant Surface) Head Motion - Eyes Closed    Stand on compliant surface:  with right foot partially in front of the other.Close eyes 60 seconds; then add moving head slowly, up and down, left and right, and diagonally  Repeat 10 of each direction per session. Do 1-2 sessions per day.   Eventually progress to feet directly one in front of the other, then to standing on one foot.

## 2018-02-18 ENCOUNTER — Encounter: Payer: No Typology Code available for payment source | Admitting: Physical Therapy

## 2018-02-22 ENCOUNTER — Encounter: Payer: No Typology Code available for payment source | Admitting: Physical Therapy

## 2018-02-22 NOTE — Patient Instructions (Signed)
Louis Brown  02/22/2018     @PREFPERIOPPHARMACY @   Your procedure is scheduled on  03/07/2018 .  [facility] to [facility] at  [facility]   A.M.  Call this number if you have problems the morning of surgery:  [phone]   Remember:  Do not eat or drink after midnight.  You may drink clear liquids until  (follow the instructions given to you).  Clear liquids allowed are:                    Water, Juice (non-citric and without pulp), Carbonated beverages, Clear Tea, Black Coffee only, Plain Jell-O only, Gatorade and Plain Popsicles only    Take these medicines the morning of surgery with A SIP OF WATER  Prozac, zyrtec.    Do not wear jewelry, make-up or nail polish.  Do not wear lotions, powders, or perfumes, or deodorant.  Do not shave 48 hours prior to surgery.  Men may shave face and neck.  Do not bring valuables to the hospital.  [facility] is not responsible for any belongings or valuables.  Contacts, dentures or bridgework may not be worn into surgery.  Leave your suitcase in the car.  After surgery it may be brought to your room.  For patients admitted to the hospital, discharge time will be determined by your treatment team.  Patients discharged the day of surgery will not be allowed to drive home.   Name and phone number of your driver:   family Special instructions:  Follow the diet and prep instructions given to you by Dr Luvenia Starchourk's office.  Please read over the following fact sheets that you were given. Anesthesia Post-op Instructions and Care and Recovery After Surgery       Colonoscopy, Adult A colonoscopy is an exam to look at the large intestine. It is done to check for problems, such as:  Lumps (tumors).  Growths (polyps).  Swelling (inflammation).  Bleeding.  What happens before the procedure? Eating and drinking Follow instructions from your doctor about eating and drinking. These instructions may include:  A few days before the procedure -  follow a low-fiber diet. ? Avoid nuts. ? Avoid seeds. ? Avoid dried fruit. ? Avoid raw fruits. ? Avoid vegetables.  1-3 days before the procedure - follow a clear liquid diet. Avoid liquids that have red or purple dye. Drink only clear liquids, such as: ? Clear broth or bouillon. ? Black coffee or tea. ? Clear juice. ? Clear soft drinks or sports drinks. ? Gelatin dessert. ? Popsicles.  On the day of the procedure - do not eat or drink anything during the 2 hours before the procedure.  Bowel prep If you were prescribed an oral bowel prep:  Take it as told by your doctor. Starting the day before your procedure, you will need to drink a lot of liquid. The liquid will cause you to poop (have bowel movements) until your poop is almost clear or light green.  If your skin or butt gets irritated from diarrhea, you may: ? Wipe the area with wipes that have medicine in them, such as adult wet wipes with aloe and vitamin E. ? Put something on your skin that soothes the area, such as petroleum jelly.  If you throw up (vomit) while drinking the bowel prep, take a break for up to 60 minutes. Then begin the bowel prep again. If you keep throwing up and you  cannot take the bowel prep without throwing up, call your doctor.  General instructions  Ask your doctor about changing or stopping your normal medicines. This is important if you take diabetes medicines or blood thinners.  Plan to have someone take you home from the hospital or clinic. What happens during the procedure?  An IV tube may be put into one of your veins.  You will be given medicine to help you relax (sedative).  To reduce your risk of infection: ? Your doctors will wash their hands. ? Your anal area will be washed with soap.  You will be asked to lie on your side with your knees bent.  Your doctor will get a long, thin, flexible tube ready. The tube will have a camera and a light on the end.  The tube will be put into  your anus.  The tube will be gently put into your large intestine.  Air will be delivered into your large intestine to keep it open. You may feel some pressure or cramping.  The camera will be used to take photos.  A small tissue sample may be removed from your body to be looked at under a microscope (biopsy). If any possible problems are found, the tissue will be sent to a lab for testing.  If small growths are found, your doctor may remove them and have them checked for cancer.  The tube that was put into your anus will be slowly removed. The procedure may vary among doctors and hospitals. What happens after the procedure?  Your doctor will check on you often until the medicines you were given have worn off.  Do not drive for 24 hours after the procedure.  You may have a small amount of blood in your poop.  You may pass gas.  You may have mild cramps or bloating in your belly (abdomen).  It is up to you to get the results of your procedure. Ask your doctor, or the department performing the procedure, when your results will be ready. This information is not intended to replace advice given to you by your health care provider. Make sure you discuss any questions you have with your health care provider. Document Released: 09/19/2010 Document Revised: 06/17/2016 Document Reviewed: 10/29/2015 Elsevier Interactive Patient Education  2017 [employer]  Colonoscopy, Adult, Care After This sheet gives you information about how to care for yourself after your procedure. Your health care provider may also give you more specific instructions. If you have problems or questions, contact your health care provider. What can I expect after the procedure? After the procedure, it is common to have:  A small amount of blood in your stool for 24 hours after the procedure.  Some gas.  Mild abdominal cramping or bloating.  Follow these instructions at home: General instructions   For the  first 24 hours after the procedure: ? Do not drive or use machinery. ? Do not sign important documents. ? Do not drink alcohol. ? Do your regular daily activities at a slower pace than normal. ? Eat soft, easy-to-digest foods. ? Rest often.  Take over-the-counter or prescription medicines only as told by your health care provider.  It is up to you to get the results of your procedure. Ask your health care provider, or the department performing the procedure, when your results will be ready. Relieving cramping and bloating  Try walking around when you have cramps or feel bloated.  Apply heat to your abdomen as told by  your health care provider. Use a heat source that your health care provider recommends, such as a moist heat pack or a heating pad. ? Place a towel between your skin and the heat source. ? Leave the heat on for 20-30 minutes. ? Remove the heat if your skin turns bright red. This is especially important if you are unable to feel pain, heat, or cold. You may have a greater risk of getting burned. Eating and drinking  Drink enough fluid to keep your urine clear or pale yellow.  Resume your normal diet as instructed by your health care provider. Avoid heavy or fried foods that are hard to digest.  Avoid drinking alcohol for as long as instructed by your health care provider. Contact a health care provider if:  You have blood in your stool 2-3 days after the procedure. Get help right away if:  You have more than a small spotting of blood in your stool.  You pass large blood clots in your stool.  Your abdomen is swollen.  You have nausea or vomiting.  You have a fever.  You have increasing abdominal pain that is not relieved with medicine. This information is not intended to replace advice given to you by your health care provider. Make sure you discuss any questions you have with your health care provider. Document Released: 03/31/2004 Document Revised: 05/11/2016  Document Reviewed: 10/29/2015 Elsevier Interactive Patient Education  2018 [place] Anesthesia is a term that refers to techniques, procedures, and medicines that help a person stay safe and comfortable during a medical procedure. Monitored anesthesia care, or sedation, is one type of anesthesia. Your anesthesia specialist may recommend sedation if you will be having a procedure that does not require you to be unconscious, such as:  Cataract surgery.  A dental procedure.  A biopsy.  A colonoscopy.  During the procedure, you may receive a medicine to help you relax (sedative). There are three levels of sedation:  Mild sedation. At this level, you may feel awake and relaxed. You will be able to follow directions.  Moderate sedation. At this level, you will be sleepy. You may not remember the procedure.  Deep sedation. At this level, you will be asleep. You will not remember the procedure.  The more medicine you are given, the deeper your level of sedation will be. Depending on how you respond to the procedure, the anesthesia specialist may change your level of sedation or the type of anesthesia to fit your needs. An anesthesia specialist will monitor you closely during the procedure. Let your health care provider know about:  Any allergies you have.  All medicines you are taking, including vitamins, herbs, eye drops, creams, and over-the-counter medicines.  Any use of steroids (by mouth or as a cream).  Any problems you or family members have had with sedatives and anesthetic medicines.  Any blood disorders you have.  Any surgeries you have had.  Any medical conditions you have, such as sleep apnea.  Whether you are pregnant or may be pregnant.  Any use of cigarettes, alcohol, or street drugs. What are the risks? Generally, this is a safe procedure. However, problems may occur, including:  Getting too much medicine  (oversedation).  Nausea.  Allergic reaction to medicines.  Trouble breathing. If this happens, a breathing tube may be used to help with breathing. It will be removed when you are awake and breathing on your own.  Heart trouble.  Lung trouble.  Before  the procedure Staying hydrated Follow instructions from your health care provider about hydration, which may include:  Up to 2 hours before the procedure - you may continue to drink clear liquids, such as water, clear fruit juice, black coffee, and plain tea.  Eating and drinking restrictions Follow instructions from your health care provider about eating and drinking, which may include:  8 hours before the procedure - stop eating heavy meals or foods such as meat, fried foods, or fatty foods.  6 hours before the procedure - stop eating light meals or foods, such as toast or cereal.  6 hours before the procedure - stop drinking milk or drinks that contain milk.  2 hours before the procedure - stop drinking clear liquids.  Medicines Ask your health care provider about:  Changing or stopping your regular medicines. This is especially important if you are taking diabetes medicines or blood thinners.  Taking medicines such as aspirin and ibuprofen. These medicines can thin your blood. Do not take these medicines before your procedure if your health care provider instructs you not to.  Tests and exams  You will have a physical exam.  You may have blood tests done to show: ? How well your kidneys and liver are working. ? How well your blood can clot.  General instructions  Plan to have someone take you home from the hospital or clinic.  If you will be going home right after the procedure, plan to have someone with you for 24 hours.  What happens during the procedure?  Your blood pressure, heart rate, breathing, level of pain and overall condition will be monitored.  An IV tube will be inserted into one of your  veins.  Your anesthesia specialist will give you medicines as needed to keep you comfortable during the procedure. This may mean changing the level of sedation.  The procedure will be performed. After the procedure  Your blood pressure, heart rate, breathing rate, and blood oxygen level will be monitored until the medicines you were given have worn off.  Do not drive for 24 hours if you received a sedative.  You may: ? Feel sleepy, clumsy, or nauseous. ? Feel forgetful about what happened after the procedure. ? Have a sore throat if you had a breathing tube during the procedure. ? Vomit. This information is not intended to replace advice given to you by your health care provider. Make sure you discuss any questions you have with your health care provider. Document Released: 05/13/2005 Document Revised: 01/24/2016 Document Reviewed: 12/08/2015 Elsevier Interactive Patient Education  2018 [place], Care After These instructions provide you with information about caring for yourself after your procedure. Your health care provider may also give you more specific instructions. Your treatment has been planned according to current medical practices, but problems sometimes occur. Call your health care provider if you have any problems or questions after your procedure. What can I expect after the procedure? After your procedure, it is common to:  Feel sleepy for several hours.  Feel clumsy and have poor balance for several hours.  Feel forgetful about what happened after the procedure.  Have poor judgment for several hours.  Feel nauseous or vomit.  Have a sore throat if you had a breathing tube during the procedure.  Follow these instructions at home: For at least 24 hours after the procedure:   Do not: ? Participate in activities in which you could fall or become injured. ? Drive. ? Use  heavy machinery. ? Drink alcohol. ? Take sleeping pills or  medicines that cause drowsiness. ? Make important decisions or sign legal documents. ? Take care of children on your own.  Rest. Eating and drinking  Follow the diet that is recommended by your health care provider.  If you vomit, drink water, juice, or soup when you can drink without vomiting.  Make sure you have little or no nausea before eating solid foods. General instructions  Have a responsible adult stay with you until you are awake and alert.  Take over-the-counter and prescription medicines only as told by your health care provider.  If you smoke, do not smoke without supervision.  Keep all follow-up visits as told by your health care provider. This is important. Contact a health care provider if:  You keep feeling nauseous or you keep vomiting.  You feel light-headed.  You develop a rash.  You have a fever. Get help right away if:  You have trouble breathing. This information is not intended to replace advice given to you by your health care provider. Make sure you discuss any questions you have with your health care provider. Document Released: 12/08/2015 Document Revised: 04/08/2016 Document Reviewed: 12/08/2015 Elsevier Interactive Patient Education  [employer].

## 2018-02-23 ENCOUNTER — Ambulatory Visit: Payer: Self-pay | Admitting: Family Medicine

## 2018-02-25 ENCOUNTER — Encounter: Payer: No Typology Code available for payment source | Admitting: Physical Therapy

## 2018-02-28 ENCOUNTER — Encounter: Payer: Self-pay | Admitting: Family Medicine

## 2018-02-28 ENCOUNTER — Ambulatory Visit: Payer: Self-pay | Attending: Family Medicine | Admitting: Family Medicine

## 2018-02-28 ENCOUNTER — Encounter (HOSPITAL_COMMUNITY): Payer: Self-pay

## 2018-02-28 ENCOUNTER — Other Ambulatory Visit: Payer: Self-pay

## 2018-02-28 ENCOUNTER — Ambulatory Visit: Payer: Self-pay | Attending: Family Medicine | Admitting: Licensed Clinical Social Worker

## 2018-02-28 ENCOUNTER — Encounter (HOSPITAL_COMMUNITY)
Admission: RE | Admit: 2018-02-28 | Discharge: 2018-02-28 | Disposition: A | Discharge status: Home / Self Care | Payer: Self-pay | Source: Ambulatory Visit | Attending: Internal Medicine | Admitting: Internal Medicine

## 2018-02-28 VITALS — BP 139/86 | HR 90 | Temp 97.4°F | Ht 72.0 in | Wt 227.4 lb

## 2018-02-28 DIAGNOSIS — E7800 Pure hypercholesterolemia, unspecified: Secondary | ICD-10-CM | POA: Insufficient documentation

## 2018-02-28 DIAGNOSIS — Z9889 Other specified postprocedural states: Secondary | ICD-10-CM | POA: Insufficient documentation

## 2018-02-28 DIAGNOSIS — I10 Essential (primary) hypertension: Secondary | ICD-10-CM

## 2018-02-28 DIAGNOSIS — F419 Anxiety disorder, unspecified: Secondary | ICD-10-CM

## 2018-02-28 DIAGNOSIS — F32A Depression, unspecified: Secondary | ICD-10-CM

## 2018-02-28 DIAGNOSIS — F329 Major depressive disorder, single episode, unspecified: Secondary | ICD-10-CM | POA: Insufficient documentation

## 2018-02-28 DIAGNOSIS — Z79899 Other long term (current) drug therapy: Secondary | ICD-10-CM | POA: Insufficient documentation

## 2018-02-28 DIAGNOSIS — R42 Dizziness and giddiness: Secondary | ICD-10-CM

## 2018-02-28 DIAGNOSIS — Z01812 Encounter for preprocedural laboratory examination: Secondary | ICD-10-CM | POA: Insufficient documentation

## 2018-02-28 DIAGNOSIS — Z885 Allergy status to narcotic agent status: Secondary | ICD-10-CM | POA: Insufficient documentation

## 2018-02-28 HISTORY — DX: Anxiety disorder, unspecified: F41.9

## 2018-02-28 HISTORY — DX: Major depressive disorder, single episode, unspecified: F32.9

## 2018-02-28 HISTORY — DX: Depression, unspecified: F32.A

## 2018-02-28 HISTORY — DX: Pure hypercholesterolemia, unspecified: E78.00

## 2018-02-28 HISTORY — DX: Benign paroxysmal vertigo, unspecified ear: H81.10

## 2018-02-28 HISTORY — DX: Essential (primary) hypertension: I10

## 2018-02-28 LAB — BASIC METABOLIC PANEL
ANION GAP: 8 (ref 5–15)
BUN: 19 mg/dL (ref 6–20)
CALCIUM: 8.8 mg/dL — AB (ref 8.9–10.3)
CO2: 25 mmol/L (ref 22–32)
CREATININE: 1.17 mg/dL (ref 0.61–1.24)
Chloride: 104 mmol/L (ref 98–111)
Glucose, Bld: 160 mg/dL — ABNORMAL HIGH (ref 70–99)
Potassium: 4.1 mmol/L (ref 3.5–5.1)
SODIUM: 137 mmol/L (ref 135–145)

## 2018-02-28 LAB — CBC WITH DIFFERENTIAL/PLATELET
BASOS ABS: 0.1 10*3/uL (ref 0.0–0.1)
BASOS PCT: 1 %
EOS ABS: 0.2 10*3/uL (ref 0.0–0.7)
EOS PCT: 3 %
HEMATOCRIT: 43.4 % (ref 39.0–52.0)
Hemoglobin: 14.8 g/dL (ref 13.0–17.0)
Lymphocytes Relative: 26 %
Lymphs Abs: 1.8 10*3/uL (ref 0.7–4.0)
MCH: 31.1 pg (ref 26.0–34.0)
MCHC: 34.1 g/dL (ref 30.0–36.0)
MCV: 91.2 fL (ref 78.0–100.0)
MONO ABS: 0.5 10*3/uL (ref 0.1–1.0)
Monocytes Relative: 7 %
NEUTROS ABS: 4.3 10*3/uL (ref 1.7–7.7)
NEUTROS PCT: 63 %
Platelets: 272 10*3/uL (ref 150–400)
RBC: 4.76 MIL/uL (ref 4.22–5.81)
RDW: 12.3 % (ref 11.5–15.5)
WBC: 6.8 10*3/uL (ref 4.0–10.5)

## 2018-02-28 MED ORDER — HYDROCHLOROTHIAZIDE 25 MG PO TABS: 25.0000 mg | ORAL_TABLET | Freq: Every day | ORAL | 6 refills | Status: DC

## 2018-02-28 MED ORDER — FLUOXETINE HCL 20 MG PO TABS: 20.0000 mg | ORAL_TABLET | Freq: Every day | ORAL | 3 refills | Status: DC

## 2018-02-28 MED FILL — HYDROCHLOROTHIAZIDE 25 MG T: 25 | 30 days supply | Qty: 30 | Fill #0

## 2018-02-28 NOTE — Patient Instructions (Signed)

## 2018-02-28 NOTE — Progress Notes (Signed)
Subjective:  Patient ID: Louis Brown, male    DOB: [birth year]  Age: 57 y.o. MRN: [id number]  CC: Dizziness   HPI Louis Brown is a 57 year old male who presents today for a follow up of dizziness.  He was seen by neurology-Dr. 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  He also underwent neuropsychological testing which revealed no cognitive impairment and memory deficit were thought to be secondary to adjustment disorder with depressed mood.  He underwent a couple of rehab sessions but informs me this was recently discontinued and he was told he would need to see ENT and is currently waiting for an ENT appointment.  He continues to complain of dizziness and states meclizine does not help his symptoms and so he discontinued it. He remains on Prozac for anxiety and depression and had informed me of improvement in his symptoms at his last visit but today states his anxiety is driven by not knowing why he continues to remain dizzy. He also complains his blood pressure is getting worse and informs me of a blood pressure of152/103 at home and at other times he had elevations up to 329 systolic while on the treadmill at rehab. He currently does not take an antihypertensive.  Past Medical History:  Diagnosis Date  . Anxiety   . Benign paroxysmal positional vertigo   . Depression   . Essential hypertension    pt states BP increases severly with excercise; has had several stress tests done  . Hypercholesteremia   . Hypertension   . Inguinal hernia     Past Surgical History:  Procedure Laterality Date  . IR RADIOLOGIST EVAL & MGMT  09/16/2017  . KNEE ARTHROSCOPY Left   . KNEE SURGERY      Allergies  Allergen Reactions  . Codeine Itching     Outpatient Medications Prior to Visit  Medication Sig Dispense Refill  . atorvastatin (LIPITOR) 20 MG tablet Take 1 tablet (20 mg total) by mouth daily. (Patient taking differently: Take 20 mg by mouth 4 (four) times a  week. ) 30 tablet 3  . cetirizine (ZYRTEC) 10 MG tablet Take 1 tablet (10 mg total) by mouth daily. (Patient taking differently: Take 10 mg by mouth at bedtime as needed for allergies. ) 30 tablet 3  . FLUoxetine (PROZAC) 20 MG tablet Take 1 tablet (20 mg total) by mouth daily. (Patient taking differently: Take 20 mg by mouth daily at 12 noon. ) 30 tablet 3  . OVER THE COUNTER MEDICATION Apply 1 application topically daily as needed (pain). CBD cream     No facility-administered medications prior to visit.     ROS Review of Systems  Constitutional: Negative for activity change and appetite change.  HENT: Negative for sinus pressure and sore throat.   Eyes: Negative for visual disturbance.  Respiratory: Negative for cough, chest tightness and shortness of breath.   Cardiovascular: Negative for chest pain and leg swelling.  Gastrointestinal: Negative for abdominal distention, abdominal pain, constipation and diarrhea.  Endocrine: Negative.   Genitourinary: Negative for dysuria.  Musculoskeletal: Negative for joint swelling and myalgias.  Skin: Negative for rash.  Allergic/Immunologic: Negative.   Neurological: Positive for light-headedness. Negative for weakness and numbness.  Psychiatric/Behavioral: Negative for dysphoric mood and suicidal ideas.       Positive for anxiety    Objective:  BP 139/86   Pulse 90   Temp (!) 97.4 F (36.3 C) (Oral)   Ht 6' (1.829 m)  Wt 227 lb 6.4 oz (103.1 kg)   SpO2 97%   BMI 30.84 kg/m   BP/Weight 02/28/2018 0/31/5945 04/05/9291  Systolic BP 446 286 381  Diastolic BP 86 771 165  Wt. (Lbs) 227.4 - -  BMI 30.84 - -      Physical Exam  Constitutional: He is oriented to person, place, and time. He appears well-developed and well-nourished.  Cardiovascular: Normal rate, normal heart sounds and intact distal pulses.  No murmur heard. Pulmonary/Chest: Effort normal and breath sounds normal. He has no wheezes. He has no rales. He exhibits no  tenderness.  Abdominal: Soft. Bowel sounds are normal. He exhibits no distension and no mass. There is no tenderness.  Musculoskeletal: Normal range of motion.  Neurological: He is alert and oriented to person, place, and time.  Skin: Skin is warm and dry.  Psychiatric: He has a normal mood and affect.     Assessment & Plan:   1. Anxiety and depression Due to underlying medical conditions LCSW called in to see the patient - FLUoxetine (PROZAC) 20 MG tablet; Take 1 tablet (20 mg total) by mouth daily at 12 noon.  Dispense: 30 tablet; Refill: 3  2. Essential hypertension Slightly elevated above goal of 130/80 He tends to have exacerbation of his blood pressures at home likely driven by anxiety and exercise We will place on hydrochlorothiazide and keep an eye on his potassium He has been advised to monitor blood pressures at home and hold off on hydrochlorothiazide if pressures in the hypotensive range - hydrochlorothiazide (HYDRODIURIL) 25 MG tablet; Take 1 tablet (25 mg total) by mouth daily.  Dispense: 30 tablet; Refill: 6 - [id number]+EGFR  3. Dizziness Secondary to vertigo He refuses taking meclizine as he states it is not effective Symptoms are relieved by neuro rehab Awaiting ENT appointment Follow-up with neurology-Dr. Tomi Likens   Meds ordered this encounter  Medications  . hydrochlorothiazide (HYDRODIURIL) 25 MG tablet    Sig: Take 1 tablet (25 mg total) by mouth daily.    Dispense:  30 tablet    Refill:  6  . FLUoxetine (PROZAC) 20 MG tablet    Sig: Take 1 tablet (20 mg total) by mouth daily at 12 noon.    Dispense:  30 tablet    Refill:  3    Follow-up: Return in about 3 months (around 05/31/2018) for Follow-up of chronic medical conditions.   Charlott Rakes MD

## 2018-03-01 ENCOUNTER — Encounter

## 2018-03-01 ENCOUNTER — Encounter: Payer: Self-pay | Admitting: Psychology

## 2018-03-01 LAB — CMP14+EGFR
ALK PHOS: 82 IU/L (ref 39–117)
ALT: 60 IU/L — ABNORMAL HIGH (ref 0–44)
AST: 34 IU/L (ref 0–40)
Albumin/Globulin Ratio: 1.9 (ref 1.2–2.2)
Albumin: 4.7 g/dL (ref 3.5–5.5)
BUN / CREAT RATIO: 17 (ref 9–20)
BUN: 17 mg/dL (ref 6–24)
Bilirubin Total: <0.2 mg/dL (ref 0.0–1.2)
CO2: 22 mmol/L (ref 20–29)
CREATININE: 1.03 mg/dL (ref 0.76–1.27)
Calcium: 9.5 mg/dL (ref 8.7–10.2)
Chloride: 103 mmol/L (ref 96–106)
GFR calc Af Amer: 94 mL/min/{1.73_m2} (ref >59)
GFR calc non Af Amer: 81 mL/min/{1.73_m2} (ref >59)
GLUCOSE: 104 mg/dL — AB (ref 65–99)
Globulin, Total: 2.5 g/dL (ref 1.5–4.5)
Potassium: 4.6 mmol/L (ref 3.5–5.2)
Sodium: 141 mmol/L (ref 134–144)
Total Protein: 7.2 g/dL (ref 6.0–8.5)

## 2018-03-01 NOTE — BH Specialist Note (Signed)
Integrated Behavioral Health Follow Up Visit  MRN: [id number] Name: Louis Foremanric Helbert  Number of Integrated Behavioral Health Clinician visits: 2/6 Session Start time: 10:40 AM  Session End time: 11:10 AM Total time: 30 minutes  Type of Service: Integrated Behavioral Health- Individual/Family Interpretor:No. Interpretor Name and Language: N/A  SUBJECTIVE: Louis Brown is a 57 y.o. male accompanied by self Patient was referred by Dr. Alvis LemmingsNewlin for anxiety. Patient reports the following symptoms/concerns: feelings of sadness and worry, difficulty sleeping, low energy, decreased concentration, and irritability Duration of problem: 1 year; Severity of problem: moderate  OBJECTIVE: Mood: Anxious and Irritable and Affect: Appropriate Risk of harm to self or others: No plan to harm self or others  LIFE CONTEXT: Family and Social: Pt resides with his ex and their minor daughter. He has a strained relationship with his ex and is currently in a custody battle. Receives limited support School/Work: Pt is unemployed due to his medical condition.  Self-Care: Pt is participating in medication management through PCP Life Changes: Pt reports an increase in anxiety triggered to ongoing medical conditions and awaiting appointments with specialists to provide clarity on treatment plan.  GOALS ADDRESSED: Patient will: 1.  Reduce symptoms of: agitation and anxiety  2.  Increase knowledge and/or ability of: coping skills and stress reduction  3.  Demonstrate ability to: Increase healthy adjustment to current life circumstances  INTERVENTIONS: Interventions utilized:  Solution-Focused Strategies and Supportive Counseling Standardized Assessments completed: GAD-7 and PHQ 2&9  ASSESSMENT: Patient currently experiencing depression and anxiety triggered by ongoing medical conditions and awaiting appointments to meet with specialists to determine an appropriate treatment plan. He reports feelings of sadness and  worry, difficulty sleeping, low energy, decreased concentration, and irritability. He currently receives limited support.   Patient participates in medication management through PCP. He states that he has seen an improvement in emotional regulation. Relaxation and self-management strategies were discussed to assist with reducing symptoms and tracking his blood pressures at home. [facility], Cheryll Dessertora Soler, who provided pt with information on ENT that accepts the [facility] discount. Pt agreed to follow up on ENT referrals.   PLAN: 1. Follow up with behavioral health clinician on : Pt was encouraged to contact LCSWA if symptoms worsen or fail to improve to [facility]. 2. Behavioral recommendations: LCSWA recommends that pt apply healthy coping skills discussed and utilize provided resources. Pt is encouraged to schedule follow up appointment with LCSWA 3. Referral(s): Integrated Behavioral Health Services (In Clinic) 4. "From scale of 1-10, how likely are you to follow plan?":   Bridgett LarssonJasmine D Lewis, LCSW 03/01/18 4:40 PM

## 2018-03-07 ENCOUNTER — Ambulatory Visit (HOSPITAL_COMMUNITY)
Admission: RE | Admit: 2018-03-07 | Discharge: 2018-03-07 | Disposition: A | Discharge status: Home / Self Care | Payer: Self-pay | Source: Ambulatory Visit | Attending: Internal Medicine | Admitting: Internal Medicine

## 2018-03-07 ENCOUNTER — Ambulatory Visit (HOSPITAL_COMMUNITY): Payer: Self-pay | Admitting: Anesthesiology

## 2018-03-07 ENCOUNTER — Encounter (HOSPITAL_COMMUNITY)
Admission: RE | Disposition: A | Discharge status: Home / Self Care | Payer: Self-pay | Source: Ambulatory Visit | Attending: Internal Medicine

## 2018-03-07 ENCOUNTER — Encounter (HOSPITAL_COMMUNITY): Payer: Self-pay | Admitting: *Deleted

## 2018-03-07 DIAGNOSIS — K625 Hemorrhage of anus and rectum: Secondary | ICD-10-CM

## 2018-03-07 DIAGNOSIS — K5730 Diverticulosis of large intestine without perforation or abscess without bleeding: Secondary | ICD-10-CM | POA: Insufficient documentation

## 2018-03-07 DIAGNOSIS — Z79899 Other long term (current) drug therapy: Secondary | ICD-10-CM | POA: Insufficient documentation

## 2018-03-07 DIAGNOSIS — Z86010 Personal history of colonic polyps: Secondary | ICD-10-CM

## 2018-03-07 DIAGNOSIS — K648 Other hemorrhoids: Secondary | ICD-10-CM | POA: Insufficient documentation

## 2018-03-07 DIAGNOSIS — K529 Noninfective gastroenteritis and colitis, unspecified: Secondary | ICD-10-CM | POA: Insufficient documentation

## 2018-03-07 DIAGNOSIS — Z885 Allergy status to narcotic agent status: Secondary | ICD-10-CM | POA: Insufficient documentation

## 2018-03-07 DIAGNOSIS — I10 Essential (primary) hypertension: Secondary | ICD-10-CM | POA: Insufficient documentation

## 2018-03-07 DIAGNOSIS — E7800 Pure hypercholesterolemia, unspecified: Secondary | ICD-10-CM | POA: Insufficient documentation

## 2018-03-07 DIAGNOSIS — F419 Anxiety disorder, unspecified: Secondary | ICD-10-CM | POA: Insufficient documentation

## 2018-03-07 DIAGNOSIS — K921 Melena: Secondary | ICD-10-CM

## 2018-03-07 DIAGNOSIS — Z87891 Personal history of nicotine dependence: Secondary | ICD-10-CM | POA: Insufficient documentation

## 2018-03-07 HISTORY — PX: BIOPSY: SHX5522

## 2018-03-07 HISTORY — PX: COLONOSCOPY WITH PROPOFOL: SHX5780

## 2018-03-07 SURGERY — COLONOSCOPY WITH PROPOFOL
Anesthesia: Monitor Anesthesia Care

## 2018-03-07 MED ORDER — CHLORHEXIDINE GLUCONATE CLOTH 2 % EX PADS: 6.0000 | MEDICATED_PAD | Freq: Once | CUTANEOUS | Status: DC

## 2018-03-07 MED ORDER — LIDOCAINE HCL (PF) 1 % IJ SOLN
INTRAMUSCULAR | Status: DC | PRN
  Administered 2018-03-07: 20 mg

## 2018-03-07 MED ORDER — PROPOFOL 10 MG/ML IV BOLUS
INTRAVENOUS | Status: AC
  Filled 2018-03-07: qty 20

## 2018-03-07 MED ORDER — PROMETHAZINE HCL 25 MG/ML IJ SOLN: 6.2500 mg | INTRAMUSCULAR | Status: DC | PRN

## 2018-03-07 MED ORDER — MEPERIDINE HCL 100 MG/ML IJ SOLN: 6.2500 mg | INTRAMUSCULAR | Status: DC | PRN

## 2018-03-07 MED ORDER — LACTATED RINGERS IV SOLN
INTRAVENOUS | Status: DC
  Administered 2018-03-07: 09:00:00 via INTRAVENOUS

## 2018-03-07 MED ORDER — PROPOFOL 10 MG/ML IV BOLUS
INTRAVENOUS | Status: DC | PRN
  Administered 2018-03-07 (×2): 50 mg via INTRAVENOUS
  Administered 2018-03-07: 100 mg via INTRAVENOUS
  Administered 2018-03-07 (×4): 50 mg via INTRAVENOUS
  Administered 2018-03-07: 100 mg via INTRAVENOUS
  Administered 2018-03-07 (×2): 50 mg via INTRAVENOUS
  Administered 2018-03-07: 100 mg via INTRAVENOUS
  Administered 2018-03-07 (×3): 50 mg via INTRAVENOUS

## 2018-03-07 MED ORDER — LACTATED RINGERS IV SOLN: INTRAVENOUS | Status: DC

## 2018-03-07 NOTE — Transfer of Care (Signed)
Immediate Anesthesia Transfer of Care Note  Patient: Louis Brown  Procedure(s) Performed: COLONOSCOPY WITH PROPOFOL (N/A ) BIOPSY  Patient Location: PACU  Anesthesia Type:MAC  Level of Consciousness: awake, alert , oriented and patient cooperative  Airway & Oxygen Therapy: Patient Spontanous Breathing  Post-op Assessment: Report given to RN and Post -op Vital signs reviewed and stable  Post vital signs: Reviewed and stable  Last Vitals:  Vitals Value Taken Time  BP    Temp    Pulse 89 03/07/2018 10:25 AM  Resp 19 03/07/2018 10:26 AM  SpO2 93 % 03/07/2018 10:25 AM  Vitals shown include unvalidated device data.  Last Pain:  Vitals:   03/07/18 0912  TempSrc:   PainSc: 0-No pain      Patients Stated Pain Goal: 5 (27/61/47 0929)  Complications: No apparent anesthesia complications

## 2018-03-07 NOTE — H&P (Signed)
@LOGO @   Primary Care Physician:  Hoy Register, MD Primary Gastroenterologist:  Dr. Jena Gauss  Pre-Procedure History & Physical: HPI:  Louis Brown is a 57 y.o. male here for further evaluation of rectal bleeding. Distant history having multiple colonic polyps removed - [facility] [place]  Past Medical History:  Diagnosis Date  . Anxiety   . Benign paroxysmal positional vertigo   . Depression   . Essential hypertension    pt states BP increases severly with excercise; has had several stress tests done  . Hypercholesteremia   . Hypertension   . Inguinal hernia     Past Surgical History:  Procedure Laterality Date  . IR RADIOLOGIST EVAL & MGMT  09/16/2017  . KNEE ARTHROSCOPY Left   . KNEE SURGERY      Prior to Admission medications   Medication Sig Start Date End Date Taking? Authorizing Provider  atorvastatin (LIPITOR) 20 MG tablet Take 1 tablet (20 mg total) by mouth daily. Patient taking differently: Take 20 mg by mouth 4 (four) times a week.  10/05/17  Yes Hoy Register, MD  cetirizine (ZYRTEC) 10 MG tablet Take 1 tablet (10 mg total) by mouth daily. Patient taking differently: Take 10 mg by mouth at bedtime as needed for allergies.  11/23/17  Yes Hoy Register, MD  FLUoxetine (PROZAC) 20 MG tablet Take 1 tablet (20 mg total) by mouth daily at 12 noon. 02/28/18  Yes Hoy Register, MD  hydrochlorothiazide (HYDRODIURIL) 25 MG tablet Take 1 tablet (25 mg total) by mouth daily. 02/28/18  Yes Hoy Register, MD  OVER THE COUNTER MEDICATION Apply 1 application topically daily as needed (pain). CBD cream   Yes [provider]    Allergies as of 01/14/2018 - Review Complete 01/14/2018  Allergen Reaction Noted  . Codeine Itching 10/25/2013    Family History  Problem Relation Age of Onset  . Kidney disease Father   . Arrhythmia Mother   . Irregular heart beat Brother   . Colon cancer Neg Hx   . Liver cancer Neg Hx   . Celiac disease Neg Hx   . Inflammatory bowel disease Neg Hx      Social History   Socioeconomic History  . Marital status: Single    Spouse name: Not on file  . Number of children: Not on file  . Years of education: Not on file  . Highest education level: Not on file  Occupational History  . Not on file  Social Needs  . Financial resource strain: Not on file  . Food insecurity:    Worry: Not on file    Inability: Not on file  . Transportation needs:    Medical: Not on file    Non-medical: Not on file  Tobacco Use  . Smoking status: Former Smoker    Packs/day: 1.00    Years: 10.00    Pack years: 10.00    Types: Cigarettes    Last attempt to quit: 08/31/2009    Years since quitting: 8.5  . Smokeless tobacco: Never Used  Substance and Sexual Activity  . Alcohol use: Yes    Comment: socially. Not daily or weekly.  . Drug use: Yes    Types: Marijuana    Comment: occ  . Sexual activity: Yes    Birth control/protection: None  Lifestyle  . Physical activity:    Days per week: Not on file    Minutes per session: Not on file  . Stress: Not on file  Relationships  . Social connections:  Talks on phone: Not on file    Gets together: Not on file    Attends religious service: Not on file    Active member of club or organization: Not on file    Attends meetings of clubs or organizations: Not on file    Relationship status: Not on file  . Intimate partner violence:    Fear of current or ex partner: Not on file    Emotionally abused: Not on file    Physically abused: Not on file    Forced sexual activity: Not on file  Other Topics Concern  . Not on file  Social History Narrative  . Not on file    Review of Systems: See HPI, otherwise negative ROS  Physical Exam: BP (!) 133/97   Pulse 94   Temp 97.8 F (36.6 C) (Oral)   Resp 18   SpO2 95%  General:   Alert,  Well-developed, well-nourished, pleasant and cooperative in NAD SNeck:  Supple; no masses or thyromegaly. No significant cervical adenopathy. Lungs:  Clear throughout  to auscultation.   No wheezes, crackles, or rhonchi. No acute distress. Heart:  Regular rate and rhythm; no murmurs, clicks, rubs,  or gallops. Abdomen: Non-distended, normal bowel sounds.  Soft and nontender without appreciable mass or hepatosplenomegaly.  Pulses:  Normal pulses noted. Extremities:  Without clubbing or edema.  Impression/Plan:   57 year old gentleman with a history of rectal bleeding intermittently. Distant history of colonic polyps. Colonoscopy today to further evaluate.  The risks, benefits, limitations, alternatives and imponderables have been reviewed with the patient. Questions have been answered. All parties are agreeable.      Notice: This dictation was prepared with Dragon dictation along with smaller phrase technology. Any transcriptional errors that result from this process are unintentional and may not be corrected upon review.

## 2018-03-07 NOTE — Anesthesia Postprocedure Evaluation (Signed)
Anesthesia Post Note  Patient: Louis Brown  Procedure(s) Performed: COLONOSCOPY WITH PROPOFOL (N/A ) BIOPSY  Anesthesia Type: MAC     Last Vitals:  Vitals:   03/07/18 1045 03/07/18 1056  BP: 117/90 122/83  Pulse: 65 67  Resp: 16 16  Temp:  (!) 36.4 C  SpO2: 95% 98%    Last Pain:  Vitals:   03/07/18 1056  TempSrc: Oral  PainSc: 6                  Louis Brown

## 2018-03-07 NOTE — Discharge Instructions (Signed)
Colonoscopy Discharge Instructions  Read the instructions outlined below and refer to this sheet in the next few weeks. These discharge instructions provide you with general information on caring for yourself after you leave the hospital. Your doctor may also give you specific instructions. While your treatment has been planned according to the most current medical practices available, unavoidable complications occasionally occur. If you have any problems or questions after discharge, call Dr. Jena Gauss at [phone]. ACTIVITY  You may resume your regular activity, but move at a slower pace for the next 24 hours.   Take frequent rest periods for the next 24 hours.   Walking will help get rid of the air and reduce the bloated feeling in your belly (abdomen).   No driving for 24 hours (because of the medicine (anesthesia) used during the test).    Do not sign any important legal documents or operate any machinery for 24 hours (because of the anesthesia used during the test).  NUTRITION  Drink plenty of fluids.   You may resume your normal diet as instructed by your doctor.   Begin with a light meal and progress to your normal diet. Heavy or fried foods are harder to digest and may make you feel sick to your stomach (nauseated).   Avoid alcoholic beverages for 24 hours or as instructed.  MEDICATIONS  You may resume your normal medications unless your doctor tells you otherwise.  WHAT YOU CAN EXPECT TODAY  Some feelings of bloating in the abdomen.   Passage of more gas than usual.   Spotting of blood in your stool or on the toilet paper.  IF YOU HAD POLYPS REMOVED DURING THE COLONOSCOPY:  No aspirin products for 7 days or as instructed.   No alcohol for 7 days or as instructed.   Eat a soft diet for the next 24 hours.  FINDING OUT THE RESULTS OF YOUR TEST Not all test results are available during your visit. If your test results are not back during the visit, make an appointment  with your caregiver to find out the results. Do not assume everything is normal if you have not heard from your caregiver or the medical facility. It is important for you to follow up on all of your test results.  SEEK IMMEDIATE MEDICAL ATTENTION IF:  You have more than a spotting of blood in your stool.   Your belly is swollen (abdominal distention).   You are nauseated or vomiting.   You have a temperature over 101.   You have abdominal pain or discomfort that is severe or gets worse throughout the day.    Colon Diverticulosis recommended.  Your colon could not be completely examined due to the presence of elongation and the known hernia  Further recommendations to follow him in the near future once pathology report is available  I am going to be recommending repair of hernia and removal of a segment of the left side of your colon so that your colon can be completely examined in the future.  PATIENT INSTRUCTIONS POST-ANESTHESIA  IMMEDIATELY FOLLOWING SURGERY:  Do not drive or operate machinery for the first twenty four hours after surgery. surgery.  Do not make any important decisions for twenty four hours after surgery or while taking narcotic pain medications or sedatives. narcotic pain medications or sedatives.  If you develop intractable nausea and vomiting or a severe headache please notify your doctor immediately.  FOLLOW-UP:  Please make an appointment with your surgeon as instructed. You do not need to follow up with  anesthesia unless specifically instructed to do so.  WOUND CARE INSTRUCTIONS (if applicable):  Keep a dry clean dressing on the anesthesia/puncture wound site if there is drainage.  Once the wound has quit draining you may leave it open to air.  Generally you should leave the bandage intact for twenty four hours unless there is drainage.  If the epidural site drains for more than 36-48 hours please call the anesthesia department. call the anesthesia department.  QUESTIONS?:  Please feel free to call your physician or the hospital operator if  you have any questions, and they will be happy to assist you.      Diverticulosis Diverticulosis is a condition that develops when small pouches (diverticula) form in the wall of the large intestine (colon). The colon is where water is absorbed and stool is formed. The pouches form when the inside layer of the colon pushes through weak spots in the outer layers of the colon. You may have a few pouches or many of them. What are the causes? The cause of this condition is not known. What increases the risk? The following factors may make you more likely to develop this condition:  Being older than age 56. Your risk for this condition increases with age. Diverticulosis is rare among people younger than age 56. people younger than age 40. By age 56, many people have it.  Eating a low-fiber diet.  Having frequent constipation.  Being overweight.  Not getting enough exercise.  Smoking.  Taking over-the-counter pain medicines, like aspirin and ibuprofen.  Having a family history of diverticulosis.  What are the signs or symptoms? In most people, there are no symptoms of this condition. If you do have symptoms, they may include:  Bloating.  Cramps in the abdomen.  Constipation or diarrhea.  Pain in the lower left side of the abdomen.  How is this diagnosed? This condition is most often diagnosed during an exam for other colon problems. Because diverticulosis usually has no symptoms, it often cannot be diagnosed independently. This condition may be diagnosed by:  Using a flexible scope to examine the colon (colonoscopy).  Taking an X-ray of the colon after dye has been put into the colon (barium enema).  Doing a CT scan.  How is this treated? You may not need treatment for this condition if you have never developed an infection related to diverticulosis. If you have had an infection before, treatment may include:  Eating a high-fiber diet. This may include eating more fruits, vegetables, and  grains.  Taking a fiber supplement.  Taking a live bacteria supplement (probiotic).  Taking medicine to relax your colon.  Taking antibiotic medicines.  Follow these instructions at home:  Drink 6-8 glasses of water or more each day to prevent constipation.  Try not to strain when you have a bowel movement.  If you have had an infection before: ? Eat more fiber as directed by your health care provider or your diet and nutrition specialist (dietitian). ? Take a fiber supplement or probiotic, if your health care provider approves.  Take over-the-counter and prescription medicines only as told by your health care provider.  If you were prescribed an antibiotic, take it as told by your health care provider. Do not stop taking the antibiotic even if you start to feel better.  Keep all follow-up visits as told by your health care provider. This is important. Contact a health care provider if:  You have pain in your abdomen.  You have bloating.  You have cramps.  You have not had  a bowel movement in 3 days. Get help right away if:  Your pain gets worse.  Your bloating becomes very bad.  You have a fever or chills, and your symptoms suddenly get worse.  You vomit.  You have bowel movements that are bloody or black.  You have bleeding from your rectum. Summary  Diverticulosis is a condition that develops when small pouches (diverticula) form in the wall of the large intestine (colon).  You may have a few pouches or many of them.  This condition is most often diagnosed during an exam for other colon problems.  If you have had an infection related to diverticulosis, treatment may include increasing the fiber in your diet, taking supplements, or taking medicines. This information is not intended to replace advice given to you by your health care provider. Make sure you discuss any questions you have with your health care provider. Document Released: 05/14/2004 Document  Revised: 07/06/2016 Document Reviewed: 07/06/2016 Elsevier Interactive Patient Education  2017 [employer]

## 2018-03-07 NOTE — Anesthesia Preprocedure Evaluation (Signed)
Anesthesia Evaluation  Patient identified by MRN, date of birth, ID band Patient awake    Reviewed: Allergy & Precautions, H&P , NPO status , Patient's Chart, lab work & pertinent test results, reviewed documented beta blocker date and time   Airway Mallampati: II  TM Distance: >3 FB Neck ROM: full    Dental no notable dental hx. (+) Teeth Intact, Dental Advidsory Given   Pulmonary neg pulmonary ROS, former smoker,    Pulmonary exam normal breath sounds clear to auscultation       Cardiovascular Exercise Tolerance: Good hypertension, On Medications + DOE  negative cardio ROS   Rhythm:regular Rate:Normal     Neuro/Psych PSYCHIATRIC DISORDERS Anxiety Depression  Neuromuscular disease negative neurological ROS  negative psych ROS   GI/Hepatic negative GI ROS, Neg liver ROS,   Endo/Other  negative endocrine ROS  Renal/GU negative Renal ROS  negative genitourinary   Musculoskeletal   Abdominal   Peds  Hematology negative hematology ROS (+)   Anesthesia Other Findings Recently dx with "Benign Positional Vertigo" after year long cardiac and neuro evaluations States significant elevation in BP w/exercise  Reproductive/Obstetrics negative OB ROS                             Anesthesia Physical Anesthesia Plan  ASA: II  Anesthesia Plan: MAC   Post-op Pain Management:    Induction:   PONV Risk Score and Plan:   Airway Management Planned:   Additional Equipment:   Intra-op Plan:   Post-operative Plan:   Informed Consent: I have reviewed the patients History and Physical, chart, labs and discussed the procedure including the risks, benefits and alternatives for the proposed anesthesia with the patient or authorized representative who has indicated his/her understanding and acceptance.   Dental Advisory Given  Plan Discussed with: CRNA and Anesthesiologist  Anesthesia Plan Comments:          Anesthesia Quick Evaluation

## 2018-03-07 NOTE — Op Note (Signed)
[facility] Patient Name: Louis Brown Procedure Date: 03/07/2018 9:01 AM MRN: [id number] Date of Birth: 01-May-1961 Attending MD: Gennette Pac , MD CSN: [id number] Age: 57 Admit Type: Outpatient Procedure:                Colonoscopy Indications:              Hematochezia Providers:                Gennette Pac, MD, Criselda Peaches. Patsy Lager, RN,                            Edythe Clarity, Technician Referring MD:              Medicines:                Propofol per Anesthesia Complications:            No immediate complications. Estimated Blood Loss:     Estimated blood loss: none. Procedure:                Pre-Anesthesia Assessment:                           - Prior to the procedure, a History and Physical                            was performed, and patient medications and                            allergies were reviewed. The patient's tolerance of                            previous anesthesia was also reviewed. The risks                            and benefits of the procedure and the sedation                            options and risks were discussed with the patient.                            All questions were answered, and informed consent                            was obtained. Prior Anticoagulants: The patient has                            taken no previous anticoagulant or antiplatelet                            agents. ASA Grade Assessment: III - A patient with                            severe systemic disease. After reviewing the risks  and benefits, the patient was deemed in                            satisfactory condition to undergo the procedure.                           After obtaining informed consent, the colonoscope                            was passed under direct vision. Throughout the                            procedure, the patient's blood pressure, pulse, and                            oxygen saturations were  monitored continuously. The                            EC-[id number] ([id number]) scope was introduced through                            the and advanced to the the hepatic flexure. The                            EC-2990Li ([id number]) scope was introduced through                            the and advanced to the. The colonoscopy was                            performed with difficulty due to a redundant colon.                            The patient tolerated the procedure well. The                            quality of the bowel preparation was adequate. Scope In: 9:17:51 AM Scope Out: 10:15:39 AM Total Procedure Duration: 0 hours 57 minutes 48 seconds  Findings:      The perianal and digital rectal examinations were normal.      A few medium-mouthed diverticula were found in the sigmoid colon and       descending colon. Inflamed appearing proximal sigmoid colon; Due to the       known hernia. lumenit was pinched off. I was able to get through the       sequestered segment and press on towards the cecum. The upstream colon       was extremely redundant and elongated. I made slow incrental steps with       multiple changing of the patient's position and external abdominal       pressure. I was on able to advance the colonoscope no further. I suspect       I made it to the area of the hepatic flexure. There was possibly a 1 cm       sessile polyp upstream; however, I was unable  to confirm or remove.      Internal hemorrhoids were found during retroflexion.      The exam was otherwise without abnormality on direct and retroflexion       views. Biopsies abno Impression:               - Diverticulosis in the sigmoid colon and in the                            descending colon.                           - Internal hemorrhoids. inflamed sigmoid                            segment?"likely due to trauma from segment moving in                            and out of the hernia. Biopsies pending. Markedly                             redundant colon. Possible polyp upstream not                            removable as described above.                           - The examination was otherwise normal on direct                            and retroflexion views.                           - No specimens collected. Moderate Sedation:      Moderate (conscious) sedation was personally administered by an       anesthesia professional. The following parameters were monitored: oxygen       saturation, heart rate, blood pressure, respiratory rate, EKG, adequacy       of pulmonary ventilation, and response to care. Total physician       intraservice time was 61 minutes. Recommendation:           follow-up on pathology. Eventual surgical refepair                            of hernia. Ideally,would advise a left                            hemicolectomy to facilitate residual colon being                            surveyed completely endoscopically in the future. Procedure Code(s):        --- Professional ---                           [phone], Colonoscopy, flexible; diagnostic, including  collection of specimen(s) by brushing or washing,                            when performed (separate procedure) Diagnosis Code(s):        --- Professional ---                           K64.8, Other hemorrhoids                           K92.1, Melena (includes Hematochezia)                           K57.30, Diverticulosis of large intestine without                            perforation or abscess without bleeding CPT copyright 2017 [employer]. All rights reserved. The codes documented in this report are preliminary and upon coder review may  be revised to meet current compliance requirements. Gerrit Friendsobert M. Rourk, MD Gennette Pacobert Michael Rourk, MD 03/07/2018 10:30:54 AM This report has been signed electronically. Number of Addenda: 0

## 2018-03-11 ENCOUNTER — Encounter (HOSPITAL_COMMUNITY): Payer: Self-pay | Admitting: Internal Medicine

## 2018-03-13 ENCOUNTER — Encounter: Payer: Self-pay | Admitting: Internal Medicine

## 2018-03-14 ENCOUNTER — Telehealth: Payer: Self-pay

## 2018-03-14 NOTE — Telephone Encounter (Signed)
Per RMR- Send letter to patient.  Send copy of letter with path to referring provider and PCP.   Patient needs to return for an office visit in 6 months to discuss colonoscopy-hopefully after he has had hernia repair surgery

## 2018-03-16 ENCOUNTER — Telehealth: Payer: Self-pay | Admitting: Family Medicine

## 2018-03-16 DIAGNOSIS — R42 Dizziness and giddiness: Secondary | ICD-10-CM

## 2018-03-16 MED FILL — ?FLUOXETINE HCL 20MG TABLET: 20 | 30 days supply | Qty: 30 | Fill #0

## 2018-03-16 NOTE — Telephone Encounter (Signed)
Pt came in to request a referral to Dr.Teal, please follow up because doctor Dillard CannonChristopher Newman was not able to see him,

## 2018-03-16 NOTE — Telephone Encounter (Signed)
Can you please, forwarder to Dr  Alvis LemmingsNewlin  I need an ENT  Referral from provider   Thank You

## 2018-03-17 NOTE — Telephone Encounter (Signed)
Noted   Sent to Dr Suszanne Connerseoh  Thank you

## 2018-03-17 NOTE — Telephone Encounter (Signed)
Referral placed.

## 2018-03-21 ENCOUNTER — Ambulatory Visit: Payer: No Typology Code available for payment source | Attending: Neurology | Admitting: Physical Therapy

## 2018-03-30 MED FILL — HYDROCHLOROTHIAZIDE 25 MG T: 25 | 30 days supply | Qty: 30 | Fill #1

## 2018-03-31 ENCOUNTER — Encounter: Payer: Self-pay | Admitting: Psychology

## 2018-04-04 ENCOUNTER — Encounter

## 2018-04-04 ENCOUNTER — Ambulatory Visit: Payer: Self-pay | Admitting: Neurology

## 2018-04-14 MED FILL — ?FLUOXETINE HCL 20MG TABLET: 20 | 30 days supply | Qty: 30 | Fill #1

## 2018-04-29 MED FILL — HYDROCHLOROTHIAZIDE 25 MG T: 25 | 30 days supply | Qty: 30 | Fill #2

## 2018-05-13 MED FILL — ?FLUOXETINE HCL 20MG TABLET: 20 | 30 days supply | Qty: 30 | Fill #2

## 2018-05-13 NOTE — Progress Notes (Signed)
NEUROLOGY FOLLOW UP OFFICE NOTE  Louis Brown [id number]  HISTORY OF PRESENT ILLNESS: Louis Brown is a 57 year old right-handed male with ulcerative colitis who follows up for dizziness.  UPDATE: He was referred for vestibular rehab.  He endorsed aural fullness in his right ear.  On Sensory Organization Testing, he was found to have a decline in vestibular functioning.  He had several sessions of treatment (Epley maneuver) but didn't improve.  He was referred to ENT but doesn't have an appointment until December.    HISTORY: Since early 2018, he reports not feeling well.  He reports feeling fatigued and experiencing joint pains.  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  On 04/24/17, he presented to the ED with acute onset of dizziness which started on the previous day.  He denied spinning sensation.  He noted sensation of feeling off balance with undulating sensation and sometimes a lightheadedness.  There was no associated headache, visual disturbance or unilateral numbness and weakness.  CT and MRI of brain without contrast were personally reviewed and were normal.  He was evaluated by cardiology.  Holter monitor, stress test and carotid ultrasound were unremarkable.    He continues to not feel well.  He particularly feels ill with increased exertion, after walking for even short period of time.  He does feel dizzy while driving, particularly when he makes sharp turns.  When ambulating, he feels like veering towards the left, particularly when turning quickly.  He reports intermittent ringing in his right ear.  With prolonged exertion, he reports problems with memory.  He once went into [employer] for 10 minutes and then forgot where he parked his car.  He sometimes forgets when his daughter is scheduled for dance class.  Over Christmas 2018, he was staying at a hotel and got confused about which button to press in the elevator.  Sometimes,  he will not recognize people he knows casually, but denies problems with face recognition of people he has known for years.  He also reports cramping in his hands.  He reports periodic brief shocks in his toes.  TSH and cortisol were normal.  Testosterone was only borderline low.    MRI of brain with and without contrast from 11/13/17 was personally reviewed and was normal.  He underwent neuropsychological testing on 12/28/17, which demonstrated no cognitive impairment with memory deficits likely secondary to adjustment disorder with depressed and anxious mood.  PAST MEDICAL HISTORY: Past Medical History:  Diagnosis Date  . Anxiety   . Benign paroxysmal positional vertigo   . Depression   . Essential hypertension    pt states BP increases severly with excercise; has had several stress tests done  . Hypercholesteremia   . Hypertension   . Inguinal hernia     MEDICATIONS: Current Outpatient Medications on File Prior to Visit  Medication Sig Dispense Refill  . atorvastatin (LIPITOR) 20 MG tablet Take 1 tablet (20 mg total) by mouth daily. (Patient taking differently: Take 20 mg by mouth 4 (four) times a week. ) 30 tablet 3  . cetirizine (ZYRTEC) 10 MG tablet Take 1 tablet (10 mg total) by mouth daily. (Patient taking differently: Take 10 mg by mouth at bedtime as needed for allergies. ) 30 tablet 3  . FLUoxetine (PROZAC) 20 MG tablet Take 1 tablet (20 mg total) by mouth daily at 12 noon. 30 tablet 3  . hydrochlorothiazide (HYDRODIURIL) 25 MG tablet Take 1  tablet (25 mg total) by mouth daily. 30 tablet 6  . OVER THE COUNTER MEDICATION Apply 1 application topically daily as needed (pain). CBD cream     No current facility-administered medications on file prior to visit.     ALLERGIES: Allergies  Allergen Reactions  . Codeine Itching    FAMILY HISTORY: Family History  Problem Relation Age of Onset  . Kidney disease Father   . Arrhythmia Mother   . Irregular heart beat Brother     . Colon cancer Neg Hx   . Liver cancer Neg Hx   . Celiac disease Neg Hx   . Inflammatory bowel disease Neg Hx    SOCIAL HISTORY: Social History   Socioeconomic History  . Marital status: Single    Spouse name: Not on file  . Number of children: Not on file  . Years of education: Not on file  . Highest education level: Not on file  Occupational History  . Not on file  Social Needs  . Financial resource strain: Not on file  . Food insecurity:    Worry: Not on file    Inability: Not on file  . Transportation needs:    Medical: Not on file    Non-medical: Not on file  Tobacco Use  . Smoking status: Former Smoker    Packs/day: 1.00    Years: 10.00    Pack years: 10.00    Types: Cigarettes    Last attempt to quit: 08/31/2009    Years since quitting: 8.7  . Smokeless tobacco: Never Used  Substance and Sexual Activity  . Alcohol use: Yes    Comment: socially. Not daily or weekly.  . Drug use: Yes    Types: Marijuana    Comment: occ  . Sexual activity: Yes    Birth control/protection: None  Lifestyle  . Physical activity:    Days per week: Not on file    Minutes per session: Not on file  . Stress: Not on file  Relationships  . Social connections:    Talks on phone: Not on file    Gets together: Not on file    Attends religious service: Not on file    Active member of club or organization: Not on file    Attends meetings of clubs or organizations: Not on file    Relationship status: Not on file  . Intimate partner violence:    Fear of current or ex partner: Not on file    Emotionally abused: Not on file    Physically abused: Not on file    Forced sexual activity: Not on file  Other Topics Concern  . Not on file  Social History Narrative  . Not on file    REVIEW OF SYSTEMS: Constitutional: No fevers, chills, or sweats, no generalized fatigue, change in appetite Eyes: No visual changes, double vision, eye pain Ear, nose and throat: No hearing loss, ear pain,  nasal congestion, sore throat Cardiovascular: No chest pain, palpitations Respiratory:  No shortness of breath at rest or with exertion, wheezes GastrointestinaI: No nausea, vomiting, diarrhea, abdominal pain, fecal incontinence Genitourinary:  No dysuria, urinary retention or frequency Musculoskeletal:  No neck pain, back pain Integumentary: No rash, pruritus, skin lesions Neurological: as above Psychiatric: No depression, insomnia, anxiety Endocrine: No palpitations, fatigue, diaphoresis, mood swings, change in appetite, change in weight, increased thirst Hematologic/Lymphatic:  No purpura, petechiae. Allergic/Immunologic: no itchy/runny eyes, nasal congestion, recent allergic reactions, rashes  PHYSICAL EXAM: Blood pressure 126/82, pulse 72, height  6' (1.829 m), weight 225 lb (102.1 kg), SpO2 97 %. General: No acute distress.  Patient appears well-groomed.   Head:  Normocephalic/atraumatic Eyes:  Fundi examined but not visualized Neck: supple, no paraspinal tenderness, full range of motion Heart:  Regular rate and rhythm Lungs:  Clear to auscultation bilaterally Back: No paraspinal tenderness Neurological Exam: alert and oriented to person, place, and time. Attention span and concentration intact, recent and remote memory intact, fund of knowledge intact.  Speech fluent and not dysarthric, language intact.  CN II-XII intact. Bulk and tone normal, muscle strength 5/5 throughout.  Sensation to light touch, temperature and vibration intact.  Deep tendon reflexes 2+ throughout, toes downgoing.  Finger to nose and heel to shin testing intact.  Gait normal, Romberg negative.  IMPRESSION: Benign paroxysmal positional vertigo.  There may be another type of vestibular dysfunction as well.  PLAN: Follow up with ENT for further evaluation  25 minutes spent face to face with patient, over 50% spent discussing management.  Shon MilletAdam Jaffe, DO  CC: Hoy RegisterEnobong Newlin, MD

## 2018-05-16 ENCOUNTER — Encounter: Payer: Self-pay | Admitting: Neurology

## 2018-05-16 ENCOUNTER — Ambulatory Visit (INDEPENDENT_AMBULATORY_CARE_PROVIDER_SITE_OTHER): Payer: No Typology Code available for payment source | Admitting: Neurology

## 2018-05-16 VITALS — BP 126/82 | HR 72 | Ht 72.0 in | Wt 225.0 lb

## 2018-05-16 DIAGNOSIS — H81391 Other peripheral vertigo, right ear: Secondary | ICD-10-CM

## 2018-05-16 NOTE — Patient Instructions (Addendum)
At this point, I think the dizziness is an inner ear issue.  1.  Follow up with ear, nose and throat 2.  The tingling pain in the thumb may be a tendonitis.  It does not sound like carpal tunnel.  You may want to ask Dr. Alvis LemmingsNewlin about seeing a hand specialist or physical medicine and rehab. 3.  Follow up as needed

## 2018-05-31 ENCOUNTER — Ambulatory Visit: Payer: Self-pay | Attending: Family Medicine | Admitting: Family Medicine

## 2018-05-31 ENCOUNTER — Encounter: Payer: Self-pay | Admitting: Family Medicine

## 2018-05-31 VITALS — BP 118/77 | HR 65 | Temp 97.5°F | Ht 72.0 in | Wt 227.2 lb

## 2018-05-31 DIAGNOSIS — Z885 Allergy status to narcotic agent status: Secondary | ICD-10-CM | POA: Insufficient documentation

## 2018-05-31 DIAGNOSIS — F419 Anxiety disorder, unspecified: Secondary | ICD-10-CM | POA: Insufficient documentation

## 2018-05-31 DIAGNOSIS — Z1159 Encounter for screening for other viral diseases: Secondary | ICD-10-CM

## 2018-05-31 DIAGNOSIS — F32A Depression, unspecified: Secondary | ICD-10-CM

## 2018-05-31 DIAGNOSIS — R29818 Other symptoms and signs involving the nervous system: Secondary | ICD-10-CM

## 2018-05-31 DIAGNOSIS — I10 Essential (primary) hypertension: Secondary | ICD-10-CM | POA: Insufficient documentation

## 2018-05-31 DIAGNOSIS — R42 Dizziness and giddiness: Secondary | ICD-10-CM | POA: Insufficient documentation

## 2018-05-31 DIAGNOSIS — F329 Major depressive disorder, single episode, unspecified: Secondary | ICD-10-CM | POA: Insufficient documentation

## 2018-05-31 DIAGNOSIS — Z79899 Other long term (current) drug therapy: Secondary | ICD-10-CM | POA: Insufficient documentation

## 2018-05-31 DIAGNOSIS — E7800 Pure hypercholesterolemia, unspecified: Secondary | ICD-10-CM | POA: Insufficient documentation

## 2018-05-31 MED ORDER — FLUOXETINE HCL 20 MG PO TABS: 20.0000 mg | ORAL_TABLET | Freq: Every day | ORAL | 3 refills | Status: AC

## 2018-05-31 MED ORDER — HYDROCHLOROTHIAZIDE 25 MG PO TABS: 25.0000 mg | ORAL_TABLET | Freq: Every day | ORAL | 6 refills | Status: AC

## 2018-05-31 MED FILL — HYDROCHLOROTHIAZIDE 25 MG T: 25 | 30 days supply | Qty: 30 | Fill #3

## 2018-05-31 NOTE — Progress Notes (Signed)
Subjective:  Patient ID: Louis Brown, male    DOB: [birth year]  Age: 57 y.o. MRN: [id number]  CC: Dizziness  HPI Louis Brown  is a 57 year old male history of hypertension who presents today for follow-up visit. 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  He also underwent neuropsychological testing which revealed no cognitive impairment and memory deficit was thought to be secondary to adjustment disorder with depressed mood.  He underwent a couple of vestibular rehab sessions but informs me this was recently discontinued and he was told he would need to see ENT but his appointment is not until December. His last visit with Neurology, Dr. Everlena Cooper was 2 weeks ago with recommendations to follow-up as needed as dizziness was thought to be secondary to an inner ear issue. He denies recent falls but states he has been close to falling. Previously placed on meclizine which he does not take as he states it worsens his daytime somnolence.  He endorses excessive yawning, daytime somnolence, fatigue but is unsure if he snores at night or wakes up to catch his breath.  He has also noticed daytime headaches.  I had commenced hydrochlorothiazide at his last office visit for elevated blood pressure and he reports feeling better since initiating it.  He also takes Prozac which he has been compliant with.  Past Medical History:  Diagnosis Date  . Anxiety   . Benign paroxysmal positional vertigo   . Depression   . Essential hypertension    pt states BP increases severly with excercise; has had several stress tests done  . Hypercholesteremia   . Hypertension   . Inguinal hernia     Past Surgical History:  Procedure Laterality Date  . BIOPSY  03/07/2018   Procedure: BIOPSY;  Surgeon: Corbin Ade, MD;  Location: [facility];  Service: Endoscopy;;  colon    . COLONOSCOPY WITH PROPOFOL N/A 03/07/2018   Procedure: COLONOSCOPY WITH PROPOFOL;  Surgeon: Corbin Ade, MD;  Location: [facility];  Service: Endoscopy;  Laterality: N/A;  9:15am  . IR RADIOLOGIST EVAL & MGMT  09/16/2017  . KNEE ARTHROSCOPY Left   . KNEE SURGERY      Allergies  Allergen Reactions  . Codeine Itching     Outpatient Medications Prior to Visit  Medication Sig Dispense Refill  . OVER THE COUNTER MEDICATION Apply 1 application topically daily as needed (pain). CBD cream    . FLUoxetine (PROZAC) 20 MG tablet Take 1 tablet (20 mg total) by mouth daily at 12 noon. 30 tablet 3  . hydrochlorothiazide (HYDRODIURIL) 25 MG tablet Take 1 tablet (25 mg total) by mouth daily. 30 tablet 6  . atorvastatin (LIPITOR) 20 MG tablet Take 1 tablet (20 mg total) by mouth daily. (Patient not taking: Reported on 05/16/2018) 30 tablet 3  . cetirizine (ZYRTEC) 10 MG tablet Take 1 tablet (10 mg total) by mouth daily. (Patient not taking: Reported on 05/16/2018) 30 tablet 3   No facility-administered medications prior to visit.     ROS Review of Systems  Constitutional: Negative for activity change and appetite change.  HENT: Negative for sinus pressure and sore throat.   Eyes: Negative for visual disturbance.  Respiratory: Negative for cough, chest tightness and shortness of breath.   Cardiovascular: Negative for chest pain and leg swelling.  Gastrointestinal: Negative  for abdominal distention, abdominal pain, constipation and diarrhea.  Endocrine: Negative.   Genitourinary: Negative for dysuria.  Musculoskeletal: Negative for joint swelling and myalgias.  Skin: Negative for rash.  Allergic/Immunologic: Negative.   Neurological: Positive for light-headedness. Negative for weakness and numbness.  Psychiatric/Behavioral: Positive for sleep disturbance. Negative for dysphoric mood and suicidal ideas.    Objective:  BP 118/77   Pulse 65   Temp (!) 97.5 F (36.4 C) (Oral)   Ht 6'  (1.829 m)   Wt 227 lb 3.2 oz (103.1 kg)   SpO2 97%   BMI 30.81 kg/m   BP/Weight 05/31/2018 05/16/2018 03/07/2018  Systolic BP 118 126 122  Diastolic BP 77 82 83  Wt. (Lbs) 227.2 225 -  BMI 30.81 30.52 -      Physical Exam  Constitutional: He is oriented to person, place, and time. He appears well-developed and well-nourished.  HENT:  Right Ear: External ear normal.  Left Ear: External ear normal.  Mouth/Throat: Oropharynx is clear and moist.  Cardiovascular: Normal rate, normal heart sounds and intact distal pulses.  No murmur heard. Pulmonary/Chest: Effort normal and breath sounds normal. He has no wheezes. He has no rales. He exhibits no tenderness.  Abdominal: Soft. Bowel sounds are normal. He exhibits no distension and no mass. There is no tenderness.  Musculoskeletal: Normal range of motion.  Neurological: He is alert and oriented to person, place, and time.  Skin: Skin is warm and dry.  Psychiatric: He has a normal mood and affect.     Assessment & Plan:   1. Anxiety and depression Stable - FLUoxetine (PROZAC) 20 MG tablet; Take 1 tablet (20 mg total) by mouth daily at 12 noon.  Dispense: 30 tablet; Refill: 3  2. Essential hypertension Controlled Counseled on blood pressure goal of less than 130/80, low-sodium, DASH diet, medication compliance, 150 minutes of moderate intensity exercise per week. Discussed medication compliance, adverse effects. - Basic Metabolic Panel - hydrochlorothiazide (HYDRODIURIL) 25 MG tablet; Take 1 tablet (25 mg total) by mouth daily.  Dispense: 30 tablet; Refill: 6  3. Screening for viral disease - Hepatitis c antibody (reflex) - HIV Antibody (routine testing w rflx)  4. Suspected sleep apnea - Split night study; Future  5. Vertigo No improvement with vestibular rehab Declines taking meclizine due to sedating side effects Has upcoming appointment with ENT   Meds ordered this encounter  Medications  . FLUoxetine (PROZAC) 20 MG  tablet    Sig: Take 1 tablet (20 mg total) by mouth daily at 12 noon.    Dispense:  30 tablet    Refill:  3  . hydrochlorothiazide (HYDRODIURIL) 25 MG tablet    Sig: Take 1 tablet (25 mg total) by mouth daily.    Dispense:  30 tablet    Refill:  6    Follow-up: Return in about 3 months (around 08/31/2018) for Follow-up of chronic medical conditions.   Hoy Register MD

## 2018-05-31 NOTE — Patient Instructions (Signed)

## 2018-06-01 LAB — BASIC METABOLIC PANEL
BUN / CREAT RATIO: 17 (ref 9–20)
BUN: 18 mg/dL (ref 6–24)
CO2: 24 mmol/L (ref 20–29)
CREATININE: 1.08 mg/dL (ref 0.76–1.27)
Calcium: 9.6 mg/dL (ref 8.7–10.2)
Chloride: 100 mmol/L (ref 96–106)
GFR calc Af Amer: 89 mL/min/{1.73_m2} (ref >59)
GFR calc non Af Amer: 77 mL/min/{1.73_m2} (ref >59)
Glucose: 85 mg/dL (ref 65–99)
POTASSIUM: 4 mmol/L (ref 3.5–5.2)
SODIUM: 141 mmol/L (ref 134–144)

## 2018-06-01 LAB — HIV ANTIBODY (ROUTINE TESTING W REFLEX): HIV Screen 4th Generation wRfx: NONREACTIVE

## 2018-06-01 LAB — HCV COMMENT:

## 2018-06-01 LAB — HEPATITIS C ANTIBODY (REFLEX)

## 2018-06-24 MED FILL — ?FLUOXETINE HCL 20MG TABLET: 20 | 30 days supply | Qty: 30 | Fill #3

## 2018-07-31 ENCOUNTER — Ambulatory Visit (HOSPITAL_BASED_OUTPATIENT_CLINIC_OR_DEPARTMENT_OTHER): Payer: Self-pay | Attending: Family Medicine

## 2018-08-01 MED FILL — HYDROCHLOROTHIAZIDE 25 MG T: 25 | 30 days supply | Qty: 30 | Fill #0

## 2018-08-01 MED FILL — FLUoxetine HCL 20 MG TABS: 20 | 30 days supply | Qty: 30 | Fill #0

## 2018-08-18 NOTE — Progress Notes (Deleted)
NEUROLOGY FOLLOW UP OFFICE NOTE  Louis Foremanric Brougham [id number]  HISTORY OF PRESENT ILLNESS: Louis Brown is a 57 year old right-handed male with ulcerative colitis, depression and anxiety who follows up for dizziness.  UPDATE: ***  HISTORY: Since early 2018, he reports not feeling well. He reports feeling fatigued and experiencing joint pains. 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 On 04/24/17, he presented to the ED with acute onset of dizziness which started on the previous day. He denied spinning sensation. He noted sensation of feeling off balance with undulating sensation and sometimes a lightheadedness. There was no associated headache, visual disturbance or unilateral numbness and weakness. CT and MRI of brain without contrast were personally reviewed and were normal. He was evaluated by cardiology. Holter monitor, stress test and carotid ultrasound were unremarkable.   He particularly feels ill with increased exertion, after walking for even short period of time. He does feel dizzy while driving, particularly when he makes sharp turns. When ambulating, he feels like veering towards the left, particularly when turning quickly. He reports intermittent ringing in his right ear.  He was referred for vestibular rehab.  He endorsed aural fullness in his right ear.  On Sensory Organization Testing, he was found to have a decline in vestibular functioning.  He had several sessions of treatment (Epley maneuver) but didn't improve.  With prolonged exertion, he reports problems with memory. He once went into [employer] for 10 minutes and then forgot where he parked his car. He sometimes forgets when his daughter is scheduled for dance class. Over Christmas 2018, he was staying at a hotel and got confused about which button to press in the elevator. Sometimes, he will not recognize people he knows casually, but denies problems with  face recognition of people he has known for years. He underwent neuropsychological testing on 12/28/17, which demonstrated no cognitive impairment with memory deficits likely secondary to adjustment disorder with depressed and anxious mood.  He also reports cramping in his hands. He reports periodic brief shocks in his toes.  TSH and cortisol were normal. Testosterone was only borderline low.   MRI of brain with and without contrast from 11/13/17 was personally reviewed and was normal.    PAST MEDICAL HISTORY: Past Medical History:  Diagnosis Date  . Anxiety   . Benign paroxysmal positional vertigo   . Depression   . Essential hypertension    pt states BP increases severly with excercise; has had several stress tests done  . Hypercholesteremia   . Hypertension   . Inguinal hernia     MEDICATIONS: Current Outpatient Medications on File Prior to Visit  Medication Sig Dispense Refill  . atorvastatin (LIPITOR) 20 MG tablet Take 1 tablet (20 mg total) by mouth daily. (Patient not taking: Reported on 05/16/2018) 30 tablet 3  . cetirizine (ZYRTEC) 10 MG tablet Take 1 tablet (10 mg total) by mouth daily. (Patient not taking: Reported on 05/16/2018) 30 tablet 3  . FLUoxetine (PROZAC) 20 MG tablet Take 1 tablet (20 mg total) by mouth daily at 12 noon. 30 tablet 3  . hydrochlorothiazide (HYDRODIURIL) 25 MG tablet Take 1 tablet (25 mg total) by mouth daily. 30 tablet 6  . OVER THE COUNTER MEDICATION Apply 1 application topically daily as needed (pain). CBD cream     No current facility-administered medications on file prior to visit.     ALLERGIES: Allergies  Allergen Reactions  . Codeine Itching  FAMILY HISTORY: Family History  Problem Relation Age of Onset  . Kidney disease Father   . Arrhythmia Mother   . Irregular heart beat Brother   . Colon cancer Neg Hx   . Liver cancer Neg Hx   . Celiac disease Neg Hx   . Inflammatory bowel disease Neg Hx     SOCIAL HISTORY: Social  History   Socioeconomic History  . Marital status: Single    Spouse name: Not on file  . Number of children: Not on file  . Years of education: Not on file  . Highest education level: Not on file  Occupational History  . Not on file  Social Needs  . Financial resource strain: Not on file  . Food insecurity:    Worry: Not on file    Inability: Not on file  . Transportation needs:    Medical: Not on file    Non-medical: Not on file  Tobacco Use  . Smoking status: Former Smoker    Packs/day: 1.00    Years: 10.00    Pack years: 10.00    Types: Cigarettes    Last attempt to quit: 08/31/2009    Years since quitting: 8.9  . Smokeless tobacco: Never Used  Substance and Sexual Activity  . Alcohol use: Yes    Comment: socially. Not daily or weekly.  . Drug use: Yes    Types: Marijuana    Comment: occ  . Sexual activity: Yes    Birth control/protection: None  Lifestyle  . Physical activity:    Days per week: Not on file    Minutes per session: Not on file  . Stress: Not on file  Relationships  . Social connections:    Talks on phone: Not on file    Gets together: Not on file    Attends religious service: Not on file    Active member of club or organization: Not on file    Attends meetings of clubs or organizations: Not on file    Relationship status: Not on file  . Intimate partner violence:    Fear of current or ex partner: Not on file    Emotionally abused: Not on file    Physically abused: Not on file    Forced sexual activity: Not on file  Other Topics Concern  . Not on file  Social History Narrative  . Not on file    REVIEW OF SYSTEMS: Constitutional: No fevers, chills, or sweats, no generalized fatigue, change in appetite Eyes: No visual changes, double vision, eye pain Ear, nose and throat: No hearing loss, ear pain, nasal congestion, sore throat Cardiovascular: No chest pain, palpitations Respiratory:  No shortness of breath at rest or with exertion,  wheezes GastrointestinaI: No nausea, vomiting, diarrhea, abdominal pain, fecal incontinence Genitourinary:  No dysuria, urinary retention or frequency Musculoskeletal:  No neck pain, back pain Integumentary: No rash, pruritus, skin lesions Neurological: as above Psychiatric: No depression, insomnia, anxiety Endocrine: No palpitations, fatigue, diaphoresis, mood swings, change in appetite, change in weight, increased thirst Hematologic/Lymphatic:  No purpura, petechiae. Allergic/Immunologic: no itchy/runny eyes, nasal congestion, recent allergic reactions, rashes  PHYSICAL EXAM: *** General: No acute distress.  Patient appears ***-groomed.  *** body habitus. Head:  Normocephalic/atraumatic Eyes:  Fundi examined but not visualized Neck: supple, no paraspinal tenderness, full range of motion Heart:  Regular rate and rhythm Lungs:  Clear to auscultation bilaterally Back: No paraspinal tenderness Neurological Exam: alert and oriented to person, place, and time. Attention span and  concentration intact, recent and remote memory intact, fund of knowledge intact.  Speech fluent and not dysarthric, language intact.  CN II-XII intact. Bulk and tone normal, muscle strength 5/5 throughout.  Sensation to light touch, temperature and vibration intact.  Deep tendon reflexes 2+ throughout, toes downgoing.  Finger to nose and heel to shin testing intact.  Gait normal, Romberg negative.  IMPRESSION: ***  PLAN: ***  Shon Millet, DO  CC: ***

## 2018-08-19 ENCOUNTER — Ambulatory Visit: Payer: No Typology Code available for payment source | Admitting: Neurology

## 2018-09-05 ENCOUNTER — Ambulatory Visit: Payer: Self-pay | Admitting: Family Medicine

## 2018-09-19 ENCOUNTER — Encounter: Payer: Self-pay | Admitting: Internal Medicine

## 2018-10-18 ENCOUNTER — Encounter: Payer: Self-pay | Admitting: Physical Therapy

## 2018-10-18 NOTE — Therapy (Signed)
[facility] [address] [place], [place], [address] Phone: [phone]   Fax:  [phone]  Patient Details  Name: Louis Brown MRN: [id number] Date of Birth: December 22, 1960 Referring Provider:  No ref. provider found  Encounter Date: 10/18/2018  PHYSICAL THERAPY DISCHARGE SUMMARY  Visits from Start of Care: 5  Current functional level related to goals / functional outcomes: Pt did not return to therapy after 02/15/18   Remaining deficits: See last progress note 02/15/18   Education / Equipment: See last progress note 02/15/18  Plan: Patient agrees to discharge.  Patient goals were not met. Patient is being discharged due to not returning since the last visit.  ?????       Rexanne Mano 10/18/2018, 10:53 AM  [facility] [address] [place], [place], [address] Phone: [phone]   Fax:  [phone]

## 2019-05-24 IMAGING — CT CT HEART MORP W/ CTA COR W/ SCORE W/ CA W/CM &/OR W/O CM
4 of 7 series · 8 of 20 positions shown, 9 images · IV contrast (APPLIED)
Comparison: None.

CLINICAL DATA: 56-year-old male with SENTENO.

EXAM:
Cardiac/Coronary  CT
TECHNIQUE: The patient was scanned on a [employer] Force scanner.

[Series 6: best diast 78 % · axial · 0.37mm/px · z∈[+1031,+1075]mm · 2 of 333 slices shown, 3 images]
[im 111/333  vessel]
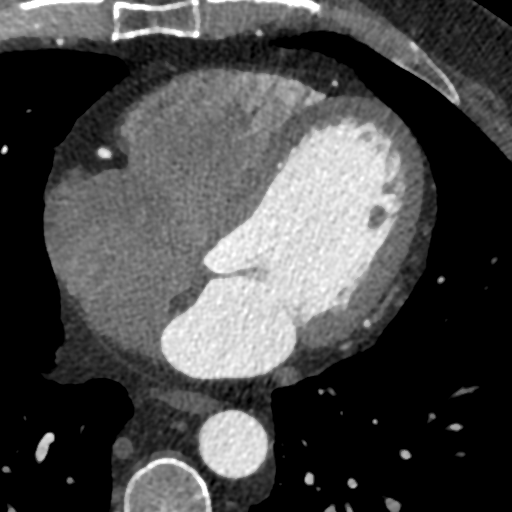
[im 111/333  lung]
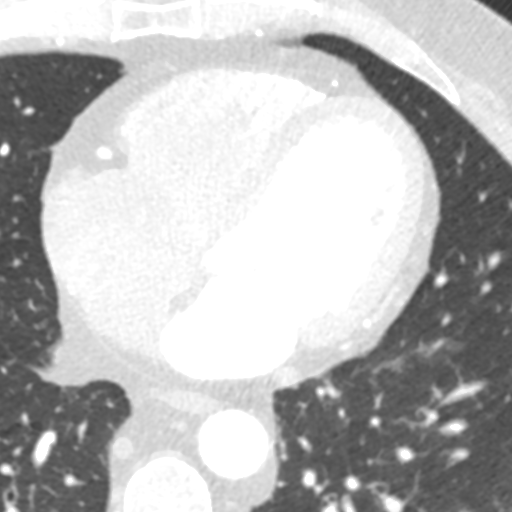
[im 222/333  vessel]
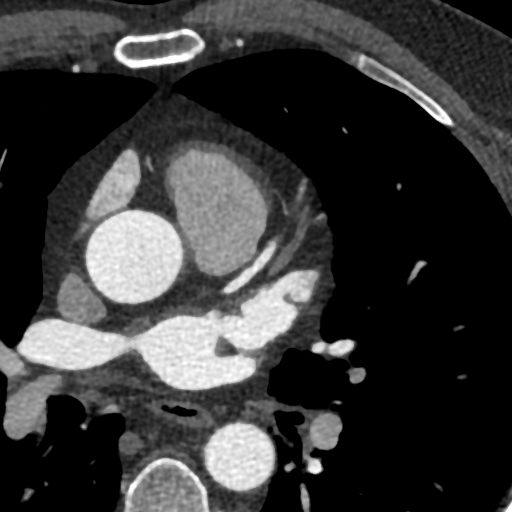

[Series 7: best syst 38 % · axial · 0.37mm/px · z∈[+1031,+1075]mm · 2 of 333 slices shown]
[im 111/333  vessel]
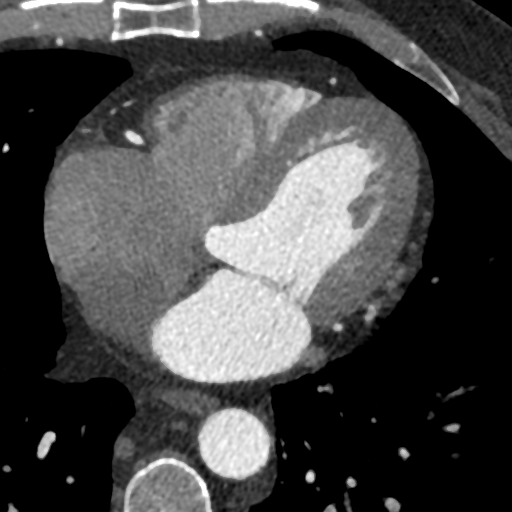
[im 222/333  vessel]
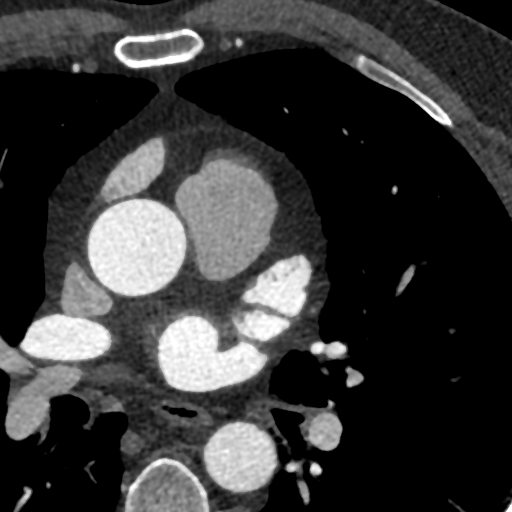

[Series 8: ts diast sharp 78 % · axial · 0.37mm/px · z∈[+1031,+1075]mm · 2 of 333 slices shown]
[im 111/333  lung]
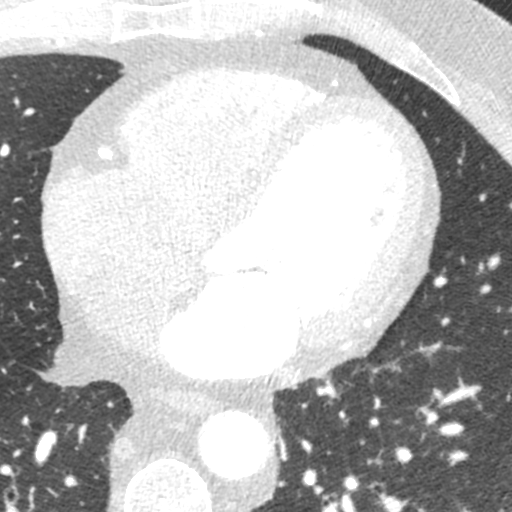
[im 222/333  lung]
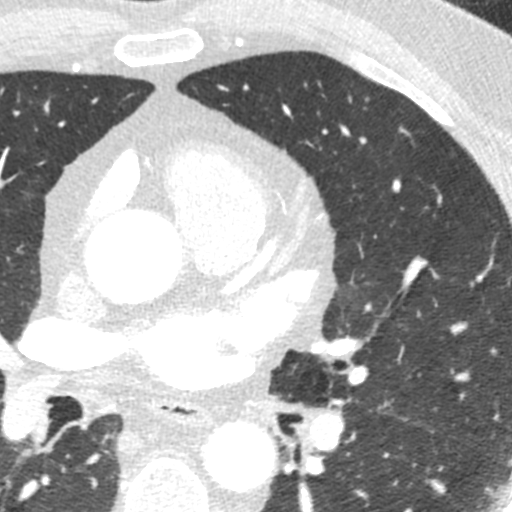

[Series 9: ts syst sharp 38 % · axial · 0.37mm/px · z∈[+1031,+1075]mm · 2 of 333 slices shown]
[im 111/333  lung]
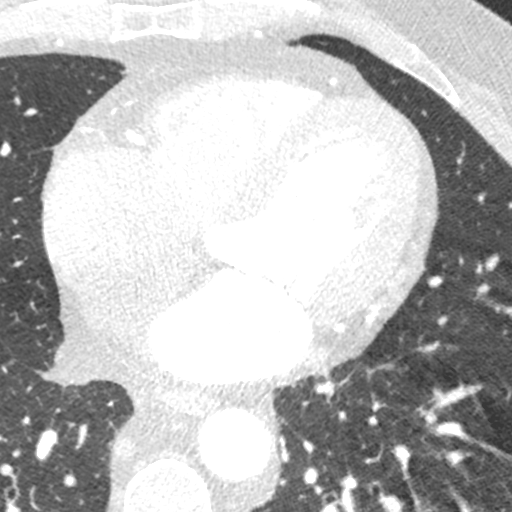
[im 222/333  lung]
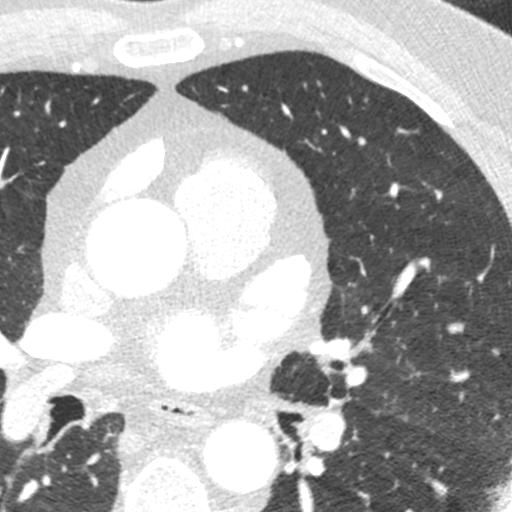

[8 of 20 positions shown; findings below may reference images not displayed]

FINDINGS: A 120 kV prospective scan was triggered in the descending thoracic
aorta at 111 HU's. Axial non-contrast 3 mm slices were carried out
through the heart. The data set was analyzed on a dedicated work
station and scored using the Agatson method. Gantry rotation speed
was 250 msecs and collimation was .6 mm. 5 mg of iv metoprolol and
0.8 mg of sl NTG was given. The 3D data set was reconstructed in 5%
intervals of the 67-82 % of the R-R cycle. Diastolic phases were
analyzed on a dedicated work station using MPR, MIP and VRT modes.
The patient received 80 cc of contrast.

Aorta:  Normal size.  No calcifications.  No dissection.

Aortic Valve:  Trileaflet.  No calcifications.

Coronary Arteries:  Normal coronary origin.  Right dominance.

RCA is a large dominant artery that gives rise to PDA and PLVB.
There is no plaque.

Left main is a large artery that gives rise to LAD and LCX arteries.
LM has no plaque.

LAD is a large vessel that gives rise to two small diagonal
arteries. There is mild diffuse predominantly calcified plaque in
the proximal and mid LAD with associated stenosis 25-50%.

LCX is a non-dominant artery that gives rise to one large OM1
branch. There is no plaque.

Other findings:

Normal pulmonary vein drainage into the left atrium.

Normal let atrial appendage without a thrombus.

Normal size of the pulmonary artery.
IMPRESSION: 1. Coronary calcium score of 6. This was 49 percentile for age and
sex matched control.

2. Normal coronary origin with right dominance.

3. There is mild diffuse predominantly calcified plaque in the
proximal and mid LAD with associated stenosis 25-50%. Aggressive
risk factor modification is recommended.

EXAM:
OVER-READ INTERPRETATION  CT CHEST

The following report is an over-read performed by radiologist Dr.
Arhinful Flourish [[email or web address]] on 09/14/2017. This
over-read does not include interpretation of cardiac or coronary
anatomy or pathology. The coronary calcium score/coronary CTA
interpretation by the cardiologist is attached.
FINDINGS: 6 x 4 mm (mean diameter 5 mm) right middle lobe pulmonary nodule
(axial image 20 of series 12). Within the visualized portions of the
thorax there are no other larger more suspicious appearing pulmonary
nodules or masses, there is no acute consolidative airspace disease,
no pleural effusions, no pneumothorax and no lymphadenopathy.
Visualized portions of the upper abdomen are unremarkable. There are
no aggressive appearing lytic or blastic lesions noted in the
visualized portions of the skeleton.
IMPRESSION: 1. 5 mm right middle lobe nodule is nonspecific. No follow-up needed
if patient is low-risk. Non-contrast chest CT can be considered in
12 months if patient is high-risk. This recommendation follows the
consensus statement: Guidelines for Management of Incidental
Pulmonary Nodules Detected on CT Images: From the [HOSPITAL]
